# Patient Record
Sex: Male | Born: 1985 | Race: White | Hispanic: No | Smoking: Current every day smoker
Health system: Southern US, Community
[De-identification: ages and names within clinical notes are randomized; demographics above are authoritative.]

## PROBLEM LIST (undated history)

## (undated) DIAGNOSIS — F909 Attention-deficit hyperactivity disorder, unspecified type: Secondary | ICD-10-CM

## (undated) DIAGNOSIS — Z789 Other specified health status: Secondary | ICD-10-CM

## (undated) HISTORY — DX: Attention-deficit hyperactivity disorder, unspecified type: F90.9

## (undated) HISTORY — PX: TESTICLE SURGERY: SHX794

## (undated) HISTORY — PX: HERNIA REPAIR: SHX51

## (undated) HISTORY — PX: BACK SURGERY: SHX140

---
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
## 2013-03-08 ENCOUNTER — Emergency Department (HOSPITAL_COMMUNITY): Payer: Medicaid Other

## 2013-03-08 ENCOUNTER — Inpatient Hospital Stay (HOSPITAL_COMMUNITY)
Admission: EM | Admit: 2013-03-08 | Discharge: 2013-03-09 | DRG: 562 | Disposition: A | Discharge status: Home / Self Care | Payer: Medicaid Other | Attending: General Surgery | Admitting: General Surgery

## 2013-03-08 ENCOUNTER — Encounter (HOSPITAL_COMMUNITY): Payer: Self-pay | Admitting: Emergency Medicine

## 2013-03-08 DIAGNOSIS — S52123A Displaced fracture of head of unspecified radius, initial encounter for closed fracture: Principal | ICD-10-CM | POA: Diagnosis present

## 2013-03-08 DIAGNOSIS — Y990 Civilian activity done for income or pay: Secondary | ICD-10-CM

## 2013-03-08 DIAGNOSIS — S060X1A Concussion with loss of consciousness of 30 minutes or less, initial encounter: Secondary | ICD-10-CM | POA: Diagnosis present

## 2013-03-08 DIAGNOSIS — S32409A Unspecified fracture of unspecified acetabulum, initial encounter for closed fracture: Secondary | ICD-10-CM

## 2013-03-08 DIAGNOSIS — S060X9A Concussion with loss of consciousness of unspecified duration, initial encounter: Secondary | ICD-10-CM

## 2013-03-08 DIAGNOSIS — S060XAA Concussion with loss of consciousness status unknown, initial encounter: Secondary | ICD-10-CM

## 2013-03-08 DIAGNOSIS — W19XXXA Unspecified fall, initial encounter: Secondary | ICD-10-CM

## 2013-03-08 DIAGNOSIS — S329XXA Fracture of unspecified parts of lumbosacral spine and pelvis, initial encounter for closed fracture: Secondary | ICD-10-CM

## 2013-03-08 DIAGNOSIS — S3210XA Unspecified fracture of sacrum, initial encounter for closed fracture: Secondary | ICD-10-CM | POA: Diagnosis present

## 2013-03-08 DIAGNOSIS — S322XXA Fracture of coccyx, initial encounter for closed fracture: Secondary | ICD-10-CM

## 2013-03-08 DIAGNOSIS — S42401A Unspecified fracture of lower end of right humerus, initial encounter for closed fracture: Secondary | ICD-10-CM

## 2013-03-08 DIAGNOSIS — W1789XA Other fall from one level to another, initial encounter: Secondary | ICD-10-CM | POA: Diagnosis present

## 2013-03-08 DIAGNOSIS — F17200 Nicotine dependence, unspecified, uncomplicated: Secondary | ICD-10-CM | POA: Diagnosis present

## 2013-03-08 LAB — CBC WITH DIFFERENTIAL/PLATELET
BASOS PCT: 0 % (ref 0–1)
Basophils Absolute: 0 10*3/uL (ref 0.0–0.1)
Eosinophils Absolute: 0.1 10*3/uL (ref 0.0–0.7)
Eosinophils Relative: 1 % (ref 0–5)
HEMATOCRIT: 45.2 % (ref 39.0–52.0)
HEMOGLOBIN: 15.6 g/dL (ref 13.0–17.0)
LYMPHS ABS: 1 10*3/uL (ref 0.7–4.0)
Lymphocytes Relative: 9 % — ABNORMAL LOW (ref 12–46)
MCH: 30.4 pg (ref 26.0–34.0)
MCHC: 34.5 g/dL (ref 30.0–36.0)
MCV: 88.1 fL (ref 78.0–100.0)
MONO ABS: 0.7 10*3/uL (ref 0.1–1.0)
MONOS PCT: 6 % (ref 3–12)
Neutro Abs: 9.7 10*3/uL — ABNORMAL HIGH (ref 1.7–7.7)
Neutrophils Relative %: 85 % — ABNORMAL HIGH (ref 43–77)
Platelets: 197 10*3/uL (ref 150–400)
RBC: 5.13 MIL/uL (ref 4.22–5.81)
RDW: 12.8 % (ref 11.5–15.5)
WBC: 11.5 10*3/uL — ABNORMAL HIGH (ref 4.0–10.5)

## 2013-03-08 LAB — POCT I-STAT, CHEM 8
BUN: 13 mg/dL (ref 6–23)
CALCIUM ION: 1.23 mmol/L (ref 1.12–1.23)
Chloride: 103 mEq/L (ref 96–112)
Creatinine, Ser: 1 mg/dL (ref 0.50–1.35)
Glucose, Bld: 100 mg/dL — ABNORMAL HIGH (ref 70–99)
HCT: 51 % (ref 39.0–52.0)
HEMOGLOBIN: 17.3 g/dL — AB (ref 13.0–17.0)
Potassium: 4.2 mEq/L (ref 3.7–5.3)
SODIUM: 143 meq/L (ref 137–147)
TCO2: 26 mmol/L (ref 0–100)

## 2013-03-08 LAB — COMPREHENSIVE METABOLIC PANEL
ALBUMIN: 4.4 g/dL (ref 3.5–5.2)
ALK PHOS: 45 U/L (ref 39–117)
ALT: 34 U/L (ref 0–53)
AST: 41 U/L — ABNORMAL HIGH (ref 0–37)
BILIRUBIN TOTAL: 0.3 mg/dL (ref 0.3–1.2)
BUN: 14 mg/dL (ref 6–23)
CHLORIDE: 104 meq/L (ref 96–112)
CO2: 27 mEq/L (ref 19–32)
CREATININE: 0.94 mg/dL (ref 0.50–1.35)
Calcium: 9.2 mg/dL (ref 8.4–10.5)
GFR calc non Af Amer: >90 mL/min (ref >90)
GLUCOSE: 112 mg/dL — AB (ref 70–99)
Potassium: 4.4 mEq/L (ref 3.7–5.3)
Sodium: 141 mEq/L (ref 137–147)
Total Protein: 7.2 g/dL (ref 6.0–8.3)

## 2013-03-08 LAB — URINALYSIS, ROUTINE W REFLEX MICROSCOPIC
Bilirubin Urine: NEGATIVE
GLUCOSE, UA: NEGATIVE mg/dL
Hgb urine dipstick: NEGATIVE
KETONES UR: NEGATIVE mg/dL
LEUKOCYTES UA: NEGATIVE
Nitrite: NEGATIVE
Protein, ur: NEGATIVE mg/dL
Specific Gravity, Urine: 1.01 (ref 1.005–1.030)
Urobilinogen, UA: 0.2 mg/dL (ref 0.0–1.0)
pH: 6.5 (ref 5.0–8.0)

## 2013-03-08 MED ORDER — OXYCODONE HCL 5 MG PO TABS
5.0000 mg | ORAL_TABLET | ORAL | Status: DC | PRN
  Administered 2013-03-09: 5 mg via ORAL
  Filled 2013-03-08: qty 1

## 2013-03-08 MED ORDER — PANTOPRAZOLE SODIUM 40 MG PO TBEC
40.0000 mg | DELAYED_RELEASE_TABLET | Freq: Every day | ORAL | Status: DC
  Administered 2013-03-09: 40 mg via ORAL
  Filled 2013-03-08: qty 1

## 2013-03-08 MED ORDER — SODIUM CHLORIDE 0.9 % IV BOLUS (SEPSIS)
1000.0000 mL | Freq: Once | INTRAVENOUS | Status: AC
  Administered 2013-03-08: 1000 mL via INTRAVENOUS

## 2013-03-08 MED ORDER — HYDROMORPHONE HCL PF 1 MG/ML IJ SOLN
1.0000 mg | INTRAMUSCULAR | Status: DC | PRN
  Administered 2013-03-08 (×2): 1 mg via INTRAVENOUS
  Filled 2013-03-08 (×2): qty 1

## 2013-03-08 MED ORDER — LORAZEPAM 2 MG/ML IJ SOLN
1.0000 mg | Freq: Once | INTRAMUSCULAR | Status: AC
  Administered 2013-03-08: 1 mg via INTRAVENOUS
  Filled 2013-03-08: qty 1

## 2013-03-08 MED ORDER — HYDROMORPHONE HCL PF 1 MG/ML IJ SOLN
1.0000 mg | Freq: Once | INTRAMUSCULAR | Status: AC
  Administered 2013-03-08: 1 mg via INTRAVENOUS
  Filled 2013-03-08: qty 1

## 2013-03-08 MED ORDER — ONDANSETRON HCL 4 MG/2ML IJ SOLN: 4.0000 mg | Freq: Four times a day (QID) | INTRAMUSCULAR | Status: DC | PRN

## 2013-03-08 MED ORDER — INFLUENZA VAC SPLIT QUAD 0.5 ML IM SUSP
0.5000 mL | INTRAMUSCULAR | Status: DC
  Filled 2013-03-08: qty 0.5

## 2013-03-08 MED ORDER — HYDROMORPHONE HCL PF 1 MG/ML IJ SOLN
INTRAMUSCULAR | Status: AC
  Filled 2013-03-08: qty 1

## 2013-03-08 MED ORDER — IOHEXOL 300 MG/ML  SOLN
100.0000 mL | Freq: Once | INTRAMUSCULAR | Status: AC | PRN
  Administered 2013-03-08: 100 mL via INTRAVENOUS

## 2013-03-08 MED ORDER — ENOXAPARIN SODIUM 40 MG/0.4ML ~~LOC~~ SOLN
40.0000 mg | SUBCUTANEOUS | Status: DC
  Administered 2013-03-09: 40 mg via SUBCUTANEOUS
  Filled 2013-03-08: qty 0.4

## 2013-03-08 MED ORDER — PNEUMOCOCCAL VAC POLYVALENT 25 MCG/0.5ML IJ INJ
0.5000 mL | INJECTION | INTRAMUSCULAR | Status: DC
  Filled 2013-03-08: qty 0.5

## 2013-03-08 MED ORDER — KCL IN DEXTROSE-NACL 20-5-0.45 MEQ/L-%-% IV SOLN
INTRAVENOUS | Status: DC
Start: 2013-03-09 — End: 2013-03-09
  Administered 2013-03-09 (×2): via INTRAVENOUS
  Filled 2013-03-08 (×4): qty 1000

## 2013-03-08 MED ORDER — TIZANIDINE HCL 4 MG PO TABS
4.0000 mg | ORAL_TABLET | Freq: Three times a day (TID) | ORAL | Status: DC | PRN
  Administered 2013-03-09: 4 mg via ORAL
  Filled 2013-03-08: qty 1

## 2013-03-08 MED ORDER — ONDANSETRON HCL 4 MG/2ML IJ SOLN
4.0000 mg | Freq: Once | INTRAMUSCULAR | Status: AC
  Administered 2013-03-08: 4 mg via INTRAVENOUS
  Filled 2013-03-08: qty 2

## 2013-03-08 MED ORDER — ACETAMINOPHEN 325 MG PO TABS
650.0000 mg | ORAL_TABLET | ORAL | Status: DC | PRN
  Administered 2013-03-09: 650 mg via ORAL
  Filled 2013-03-08: qty 2

## 2013-03-08 MED ORDER — HYDROMORPHONE HCL PF 1 MG/ML IJ SOLN
1.0000 mg | INTRAMUSCULAR | Status: DC | PRN
  Administered 2013-03-09 (×3): 1 mg via INTRAVENOUS
  Filled 2013-03-08: qty 2
  Filled 2013-03-08 (×2): qty 1

## 2013-03-08 MED ORDER — PANTOPRAZOLE SODIUM 40 MG IV SOLR
40.0000 mg | Freq: Every day | INTRAVENOUS | Status: DC
  Filled 2013-03-08: qty 40

## 2013-03-08 MED ORDER — HYDROMORPHONE HCL PF 1 MG/ML IJ SOLN
1.0000 mg | Freq: Once | INTRAMUSCULAR | Status: AC
  Administered 2013-03-08: 1 mg via INTRAVENOUS

## 2013-03-08 MED ORDER — ONDANSETRON HCL 4 MG PO TABS: 4.0000 mg | ORAL_TABLET | Freq: Four times a day (QID) | ORAL | Status: DC | PRN

## 2013-03-08 MED ORDER — ONDANSETRON HCL 4 MG/2ML IJ SOLN
4.0000 mg | Freq: Three times a day (TID) | INTRAMUSCULAR | Status: DC | PRN
  Administered 2013-03-08: 4 mg via INTRAVENOUS
  Filled 2013-03-08: qty 2

## 2013-03-08 MED ORDER — OXYCODONE HCL 5 MG PO TABS
10.0000 mg | ORAL_TABLET | ORAL | Status: DC | PRN
  Administered 2013-03-09 (×4): 10 mg via ORAL
  Filled 2013-03-08 (×4): qty 2

## 2013-03-08 MED ORDER — SODIUM CHLORIDE 0.9 % IV SOLN
INTRAVENOUS | Status: AC
  Administered 2013-03-08: 23:00:00 via INTRAVENOUS

## 2013-03-08 NOTE — ED Notes (Signed)
Pt reports was approx 25 feet up in a tree cutting the tree down.  Pt says fell out of the tree and landed on r side.  Pt says has lost consciousness twice.  C/O pain to r elbow, left hip and pelvis area.  Pt shaking.

## 2013-03-08 NOTE — ED Notes (Signed)
Beeped through [facility] to [phone].Trauma

## 2013-03-08 NOTE — Progress Notes (Signed)
Dr. Janee Mornhompson paged upon patient's arrival to the floor. Patient oriented to room and call bell. Patient rating pain 5 out of 10; will consult with MD regarding pain medication regimen. Dr. Janee Mornhompson now at the bedside discussing care with patient and patient's wife. Patient resting comfortably in bed. Nursing will continue to monitor.

## 2013-03-08 NOTE — ED Provider Notes (Signed)
CSN: [id number]     Arrival date & time 03/08/13  1042 History  This chart was scribed for Benny Lennert, MD,  by Ashley Jacobs, ED Scribe. The patient was seen in room APA02/APA02 and the patient's care was started at 11:19 AM.    First MD Initiated Contact with Patient 03/08/13 1114     Chief Complaint  Patient presents with  . Fall   (Consider location/radiation/quality/duration/timing/severity/associated sxs/prior Treatment) Patient is a 28 y.o. male presenting with fall. The history is provided by the patient and medical records (the pt fell 25 feet from a tree with loc.  landed on dirt.  pain left groin and right elbow). No language interpreter was used.  Fall This is a new problem. The current episode started 1 to 2 hours ago. The problem occurs constantly. The problem has not changed since onset.Pertinent negatives include no chest pain and no abdominal pain. Nothing aggravates the symptoms. Nothing relieves the symptoms.   HPI Comments: DIJON COSENS is a 28 y.o. male who presents to the Emergency Department complaining of fall that occurred today. Pt states while cutting a tree down he fell 25 feet landing on his right side. Pt states he landed head first and had LOC twice.  He is experiencing pain from his right elbow down and his left hip that extend to his lower pelvis. He denies chest pain and abdominal pain. Pt does not have any known allergies to medications. Pt states he is experiencing left leg tremors due to pain.  History reviewed. No pertinent past medical history. Past Surgical History  Procedure Laterality Date  . Hernia repair     No family history on file. History  Substance Use Topics  . Smoking status: Current Every Day Smoker  . Smokeless tobacco: Not on file  . Alcohol Use: Yes     Comment: occ    Review of Systems  Constitutional: Negative for appetite change and fatigue.  HENT: Negative for congestion, ear discharge and sinus pressure.   Eyes:  Negative for discharge.  Respiratory: Negative for cough.   Cardiovascular: Negative for chest pain.  Gastrointestinal: Negative for abdominal pain and diarrhea.  Genitourinary: Negative for frequency and hematuria.  Musculoskeletal: Positive for arthralgias and gait problem. Negative for back pain.  Skin: Negative for rash.  Neurological: Positive for tremors (associated with pain) and syncope. Negative for seizures.  Psychiatric/Behavioral: Negative for hallucinations.  All other systems reviewed and are negative.    Allergies  Review of patient's allergies indicates no known allergies.  Home Medications  No current outpatient prescriptions on file. BP 147/76  Pulse 71  Temp(Src) 97.9 F (36.6 C) (Oral)  Resp 20  Ht 6\' 1"  (1.854 m)  Wt 160 lb (72.576 kg)  BMI 21.11 kg/m2  SpO2 100% Physical Exam  Constitutional: He is oriented to person, place, and time. He appears well-developed.  HENT:  Head: Normocephalic.  Eyes: Conjunctivae and EOM are normal. No scleral icterus.  Neck: Neck supple. No tracheal deviation present. No thyromegaly present.  Cardiovascular: Normal rate and regular rhythm.  Exam reveals no gallop and no friction rub.   No murmur heard. Pulmonary/Chest: No stridor. He has no wheezes. He has no rales. He exhibits no tenderness.  Abdominal: He exhibits no distension. There is no tenderness. There is no rebound.  Musculoskeletal: Normal range of motion. He exhibits tenderness. He exhibits no edema.  Tenderness over right elbow: neurovascularly intact Tenderness over the hip and left inguinal area: neurovascularly intact  Lymphadenopathy:    He has no cervical adenopathy.  Neurological: He is oriented to person, place, and time. He exhibits normal muscle tone. Coordination normal.  Skin: Skin is warm. No rash noted. No erythema.  Psychiatric: He has a normal mood and affect. His behavior is normal.    ED Course  Procedures (including critical care  time) DIAGNOSTIC STUDIES: Oxygen Saturation is 100% on room air, normal by my interpretation.    COORDINATION OF CARE:  11:22 AM Discussed course of care with pt which includes  Pelvic x-ray. Pt understands and agrees.   CRITICAL CARE Performed by: Amee Boothe L Total critical care time: 45 Critical care time was exclusive of separately billable procedures and treating other patients. Critical care was necessary to treat or prevent imminent or life-threatening deterioration. Critical care was time spent personally by me on the following activities: development of treatment plan with patient and/or surrogate as well as nursing, discussions with consultants, evaluation of patient's response to treatment, examination of patient, obtaining history from patient or surrogate, ordering and performing treatments and interventions, ordering and review of laboratory studies, ordering and review of radiographic studies, pulse oximetry and re-evaluation of patient's condition.  Labs Review Labs Reviewed - No data to display Imaging Review No results found.  EKG Interpretation   None       MDM  fx pelvis and right arm.  Admit to dr..  Janee Mornhompson cone   Benny LennertJoseph L Donavon Kimrey, MD 03/08/13 [phone]

## 2013-03-08 NOTE — ED Notes (Signed)
Pt c/o right elbow and arm pain as well as left hip and pelvic pain. Pt states he fell approximately 25 feet from a tree and landed on his right side. Pt reports LOC x2 at scene of incident. Pt was not ambulatory after fall. Limited movement in RUE and LLE. Deformity noted on right elbow.

## 2013-03-09 ENCOUNTER — Inpatient Hospital Stay (HOSPITAL_COMMUNITY): Payer: Medicaid Other

## 2013-03-09 LAB — CBC
HCT: 40.2 % (ref 39.0–52.0)
HEMOGLOBIN: 13.7 g/dL (ref 13.0–17.0)
MCH: 30.2 pg (ref 26.0–34.0)
MCHC: 34.1 g/dL (ref 30.0–36.0)
MCV: 88.7 fL (ref 78.0–100.0)
PLATELETS: 140 10*3/uL — AB (ref 150–400)
RBC: 4.53 MIL/uL (ref 4.22–5.81)
RDW: 13.2 % (ref 11.5–15.5)
WBC: 8 10*3/uL (ref 4.0–10.5)

## 2013-03-09 LAB — BASIC METABOLIC PANEL
BUN: 8 mg/dL (ref 6–23)
CALCIUM: 8.1 mg/dL — AB (ref 8.4–10.5)
CHLORIDE: 105 meq/L (ref 96–112)
CO2: 23 meq/L (ref 19–32)
Creatinine, Ser: 0.84 mg/dL (ref 0.50–1.35)
GFR calc Af Amer: >90 mL/min (ref >90)
GFR calc non Af Amer: >90 mL/min (ref >90)
GLUCOSE: 86 mg/dL (ref 70–99)
Potassium: 4 mEq/L (ref 3.7–5.3)
Sodium: 138 mEq/L (ref 137–147)

## 2013-03-09 MED ORDER — OXYCODONE-ACETAMINOPHEN 5-325 MG PO TABS
1.0000 | ORAL_TABLET | ORAL | Status: DC | PRN
  Administered 2013-03-09 (×2): 1 via ORAL
  Filled 2013-03-09 (×2): qty 1

## 2013-03-09 MED ORDER — DIPHENHYDRAMINE HCL 25 MG PO CAPS: 25.0000 mg | ORAL_CAPSULE | Freq: Four times a day (QID) | ORAL | Status: DC | PRN

## 2013-03-09 MED ORDER — KETOROLAC TROMETHAMINE 30 MG/ML IJ SOLN
30.0000 mg | Freq: Once | INTRAMUSCULAR | Status: AC
  Administered 2013-03-09: 30 mg via INTRAVENOUS
  Filled 2013-03-09: qty 1

## 2013-03-09 MED ORDER — OXYCODONE HCL 5 MG PO TABS: 5.0000 mg | ORAL_TABLET | ORAL | Status: DC | PRN

## 2013-03-09 MED ORDER — DIPHENHYDRAMINE HCL 50 MG/ML IJ SOLN
25.0000 mg | Freq: Four times a day (QID) | INTRAMUSCULAR | Status: DC | PRN
  Administered 2013-03-09 (×2): 25 mg via INTRAVENOUS
  Filled 2013-03-09 (×2): qty 1

## 2013-03-09 MED ORDER — KETOROLAC TROMETHAMINE 15 MG/ML IJ SOLN
15.0000 mg | Freq: Four times a day (QID) | INTRAMUSCULAR | Status: DC
  Administered 2013-03-09: 15 mg via INTRAVENOUS
  Filled 2013-03-09: qty 1

## 2013-03-09 MED ORDER — OXYCODONE-ACETAMINOPHEN 5-325 MG PO TABS: 1.0000 | ORAL_TABLET | ORAL | Status: DC | PRN

## 2013-03-09 NOTE — Discharge Summary (Signed)
Physician Discharge Summary  Patient ID: Clinton Evans MRN: [id number] DOB/AGE: 28/08/1985 28 y.o.  Admit date: 03/08/2013 Discharge date: 03/09/2013  Admission Diagnoses:  Discharge Diagnoses:  Active Problems:   Pelvis fracture   Acetabular fracture   Discharged Condition: good  Hospital Course: Admitted after falling out of a tree at work.  Sustained non-displaced pelvic fractures and a shattered right radial head.  Will need surgery to replace radial head in the near future  Consults: orthopedic surgery  Significant Diagnostic Studies: labs: basic bloodwork and radiology: CXR: normal, X-Ray: pelvis with ala and rami fractures minimally displaced and Extremity fractures which demonstrate comminuted radial head fracture.  CT done also of elbow.  Treatments: IV hydration and analgesia: Dilaudid and Percocet and Toradol  Discharge Exam: Blood pressure 117/72, pulse 54, temperature 97.4 F (36.3 C), temperature source Oral, resp. rate 18, height 6\' 1"  (1.854 m), weight 72.576 kg (160 lb), SpO2 100.00%. General appearance: alert, cooperative, appears stated age, no distress and appears anxious Chest wall: no tenderness Extremities: right arm in sling for right elbow fracture  Disposition: Final discharge disposition not confirmed      Discharge Orders   Future Orders Complete By Expires   Call MD for:  difficulty breathing, headache or visual disturbances  As directed    Call MD for:  extreme fatigue  As directed    Call MD for:  hives  As directed    Call MD for:  persistant dizziness or light-headedness  As directed    Call MD for:  persistant nausea and vomiting  As directed    Call MD for:  redness, tenderness, or signs of infection (pain, swelling, redness, odor or green/yellow discharge around incision site)  As directed    Call MD for:  severe uncontrolled pain  As directed    Call MD for:  temperature >100.4  As directed    Diet general  As directed    Discharge  instructions  As directed    Comments:     Do not attempt to use right arm or elbow.  Keep in sling at all times.  Will have surgery scheduled within the next 5-10 days.  Dr. Magdalene PatriciaHandy's office will call to make arrangements.   Driving Restrictions  As directed    Comments:     No driving until cleared by orthopedic surgeon   Increase activity slowly  As directed    Comments:     Do not attempt to drive or work until cleared by the orthopedic surgeon.   Lifting restrictions  As directed    Comments:     No lifting until otherwise instructed.   May shower / Bathe  As directed    May walk up steps  As directed    Other Restrictions  As directed    Comments:     No smoking.  This will impede healing.       Medication List         ibuprofen 200 MG tablet  Commonly known as:  ADVIL,MOTRIN  Take 200 mg by mouth every 6 (six) hours as needed for moderate pain.     oxyCODONE 5 MG immediate release tablet  Commonly known as:  Oxy IR/ROXICODONE  Take 1 tablet (5 mg total) by mouth every 4 (four) hours as needed for severe pain.     oxyCODONE-acetaminophen 5-325 MG per tablet  Commonly known as:  PERCOCET/ROXICET  Take 1 tablet by mouth every 4 (four) hours as needed for  severe pain.       Follow-up Information   Schedule an appointment as soon as possible for a visit with Budd Palmer, MD. (he will call this patient to schedule surgery)    Specialty:  Orthopedic Surgery   Contact information:   [address] [place] [address] [phone]       Signed: Cherylynn Ridges 03/09/2013, 5:25 PM

## 2013-03-09 NOTE — Consult Note (Signed)
Orthopaedic Trauma Service Consult  Pt seen and examined Consult dictated: [id number]  A/P  1. Fall 2. TBI 3. L LC1 pelvic ring fx  wbat  No rom restrictions  PT/OT   Plain films of pelvis for baseline- AP, inlet/outlet  4. R Radial head fx  CT scan to determine if surgery indicated  5. Continue per TS  Mearl LatinKeith W. Venetia Prewitt, PA-C [facility] [phone] (P) 03/09/2013 9:38 AM

## 2013-03-09 NOTE — Progress Notes (Signed)
Orthopedic Tech Progress Note Patient Details:  Clinton Evans 10/30/1985 [id number] Arm sling applied for immobilization and comfort Ortho Devices Type of Ortho Device: Arm sling Ortho Device/Splint Location: Righr Ortho Device/Splint Interventions: Application   Asia R Thompson 03/09/2013, 9:45 AM

## 2013-03-09 NOTE — Progress Notes (Signed)
Trauma Service Note  Subjective: Patient very uncomfortable, mostly his right elbow.  Pain medications not working well.  Objective: Vital signs in last 24 hours: Temp:  [97.1 F (36.2 C)-98.3 F (36.8 C)] 97.1 F (36.2 C) (01/07 0900) Pulse Rate:  [58-113] 59 (01/07 0900) Resp:  [12-20] 18 (01/07 0900) BP: (101-147)/(48-76) 112/63 mmHg (01/07 0900) SpO2:  [98 %-100 %] 100 % (01/07 0900) Weight:  [72.576 kg (160 lb)] 72.576 kg (160 lb) (01/06 1108) Last BM Date: 03/08/13  Intake/Output from previous day: 01/06 0701 - 01/07 0700 In: 1265.3 [P.O.:422; I.V.:843.3] Out: -  Intake/Output this shift: Total I/O In: 420 [P.O.:420] Out: -   General: Does not appear to be in acute distress  Lungs: Clear  Abd: Benign  Extremities: Right arm in sling  Neuro: Intact  Lab Results: CBC   Recent Labs  03/08/13 1122 03/08/13 1145 03/09/13 0335  WBC 11.5*  --  8.0  HGB 15.6 17.3* 13.7  HCT 45.2 51.0 40.2  PLT 197  --  140*   BMET  Recent Labs  03/08/13 1122 03/08/13 1145 03/09/13 0335  NA 141 143 138  K 4.4 4.2 4.0  CL 104 103 105  CO2 27  --  23  GLUCOSE 112* 100* 86  BUN 14 13 8   CREATININE 0.94 1.00 0.84  CALCIUM 9.2  --  8.1*   PT/INR No results found for this basename: LABPROT, INR,  in the last 72 hours ABG No results found for this basename: PHART, PCO2, PO2, HCO3,  in the last 72 hours  Studies/Results: Dg Elbow Complete Right  03/08/2013   CLINICAL DATA:  Fall.  Pain.  EXAM: RIGHT ELBOW - COMPLETE 3+ VIEW  COMPARISON:  None.  FINDINGS: Comminuted impaction fracture of the right radial head. Radial head may be slightly subluxed laterally. Joint effusion.  IMPRESSION: Comminuted impaction fracture of the right radial head. Radial head may be slightly subluxed laterally. Joint effusion.   Electronically Signed   By: Bridgett LarssonSteve  Olson M.D.   On: 03/08/2013 12:53   Dg Hip Complete Left  03/08/2013   CLINICAL DATA:  Left hip pain post fall in 25 feet from tree.   EXAM: LEFT HIP - COMPLETE 2+ VIEW  COMPARISON:  CT same date.  FINDINGS: Left sacral fracture.  The left acetabular fracture and possible left femoral head fracture are better delineated on the CT performed at same time. Please see report from such.  IMPRESSION: Left sacral fracture.  The left acetabular fracture and possible left femoral head fracture are better delineated on the CT performed at same time. Please see report from such.   Electronically Signed   By: Bridgett LarssonSteve  Olson M.D.   On: 03/08/2013 12:32   Ct Head Wo Contrast  03/08/2013   CLINICAL DATA:  25 foot fall with loss of consciousness. Right-sided arm pain.  EXAM: CT HEAD WITHOUT CONTRAST  CT CERVICAL SPINE WITHOUT CONTRAST  TECHNIQUE: Multidetector CT imaging of the head and cervical spine was performed following the standard protocol without intravenous contrast. Multiplanar CT image reconstructions of the cervical spine were also generated.  COMPARISON:  None.  FINDINGS: CT HEAD FINDINGS  No acute cortical infarct, hemorrhage, or mass lesion is present. The ventricles are of normal size. And no significant extra-axial fluid collection is present.  A comminuted right nasal bone fractures appear remote. There is no significant soft tissue swelling associated. Please correlate with point tenderness. The osseous skull is otherwise intact. The paranasal sinuses and mastoid air  cells are clear. No significant extracranial soft tissue injury is evident.  CT CERVICAL SPINE FINDINGS  The cervical spine is imaged from the skull base through T1-2. Vertebral body heights and alignment are maintained. No acute fracture or traumatic subluxation is evident. The lung apices are clear.  IMPRESSION: 1. No acute intracranial abnormality. 2. Comminuted right nasal bone fractures appear remote. 3. Negative CT of the cervical spine.   Electronically Signed   By: Gennette Pac M.D.   On: 03/08/2013 12:23   Ct Chest W Contrast  03/08/2013   CLINICAL DATA:  28 year old  male status post 25 foot fall. Positive loss of consciousness. Pain. Back pain. Initial encounter.  EXAM: CT CHEST, ABDOMEN, AND PELVIS WITH CONTRAST  TECHNIQUE: Multidetector CT imaging of the chest, abdomen and pelvis was performed following the standard protocol during bolus administration of intravenous contrast.  CONTRAST:  OMNIPAQUE IOHEXOL 300 MG/ML  SOLN  COMPARISON:  Cervical spine CT from the same day reported separately.  FINDINGS: CT CHEST FINDINGS  Major airways are patent. No pneumothorax. No abnormal pulmonary opacity. No pericardial or pleural effusion. Negative soft tissues at the thoracic inlet. No mediastinal hematoma. Major mediastinal vascular structures appear within normal limits.  No superficial chest wall soft tissue injury identified. Bone mineralization is within normal limits. Clavicles intact. Sternum intact. No rib fracture identified. Visible shoulder osseous structures including the scapula appear intact. Paraspinal soft tissues appear within normal limits. No thoracic vertebral fracture identified.  CT ABDOMEN AND PELVIS FINDINGS  Normal lumbar segmentation.  Lumbar spine intact.  Nondisplaced left sacral ala fracture (series 2, image 101). Otherwise the sacrum appears intact. SI joints intact. Subtle nondisplaced left pelvic fracture at the confluence of the acetabulum and superior pubic ramus, see series 2, image 117 and coronal images 33-36. Pubic rami intact. Proximal left femur appears intact except for a an unusual linear hypodensity in the left femoral head, without definite associated cortical disruption (see series 2, image 121). Elsewhere the pelvis and proximal femurs appear intact.  No pelvic free fluid. Negative distal colon. Unremarkable bladder. Negative sigmoid and left colon. Negative transverse colon.  In the right lower quadrant there is trace free fluid adjacent to the cecum. Evidence of a normal retrocecal appendix. No cecal wall thickening. Fluid-filled  but otherwise negative terminal ileum. No dilated small bowel. Negative stomach and duodenum.  No pneumoperitoneum. No abdominal free fluid. Liver, gallbladder, spleen, pancreas, adrenal glands, and kidneys are intact. Portal venous system and major arterial structures in the abdomen and pelvis appear within normal limits. Evidence of a previous right inguinal hernia repair.  IMPRESSION: 1. Subtle nondisplaced left acetabular and left sacral ala fractures. 1 usual linear lucency through the left femoral head (series 2, image 121), but no definite proximal left femur fracture. 2. Small volume free fluid in the right lower quadrant. This is abnormal in a male patient but nonspecific. Bowel injury can be occult on initial trauma CT scans. 3. No acute traumatic injury identified in the chest.   Electronically Signed   By: Augusto Gamble M.D.   On: 03/08/2013 12:42   Ct Cervical Spine Wo Contrast  03/08/2013   CLINICAL DATA:  25 foot fall with loss of consciousness. Right-sided arm pain.  EXAM: CT HEAD WITHOUT CONTRAST  CT CERVICAL SPINE WITHOUT CONTRAST  TECHNIQUE: Multidetector CT imaging of the head and cervical spine was performed following the standard protocol without intravenous contrast. Multiplanar CT image reconstructions of the cervical spine were also generated.  COMPARISON:  None.  FINDINGS: CT HEAD FINDINGS  No acute cortical infarct, hemorrhage, or mass lesion is present. The ventricles are of normal size. And no significant extra-axial fluid collection is present.  A comminuted right nasal bone fractures appear remote. There is no significant soft tissue swelling associated. Please correlate with point tenderness. The osseous skull is otherwise intact. The paranasal sinuses and mastoid air cells are clear. No significant extracranial soft tissue injury is evident.  CT CERVICAL SPINE FINDINGS  The cervical spine is imaged from the skull base through T1-2. Vertebral body heights and alignment are maintained.  No acute fracture or traumatic subluxation is evident. The lung apices are clear.  IMPRESSION: 1. No acute intracranial abnormality. 2. Comminuted right nasal bone fractures appear remote. 3. Negative CT of the cervical spine.   Electronically Signed   By: Gennette Pac M.D.   On: 03/08/2013 12:23   Ct Abdomen Pelvis W Contrast  03/08/2013   CLINICAL DATA:  28 year old male status post 25 foot fall. Positive loss of consciousness. Pain. Back pain. Initial encounter.  EXAM: CT CHEST, ABDOMEN, AND PELVIS WITH CONTRAST  TECHNIQUE: Multidetector CT imaging of the chest, abdomen and pelvis was performed following the standard protocol during bolus administration of intravenous contrast.  CONTRAST:  OMNIPAQUE IOHEXOL 300 MG/ML  SOLN  COMPARISON:  Cervical spine CT from the same day reported separately.  FINDINGS: CT CHEST FINDINGS  Major airways are patent. No pneumothorax. No abnormal pulmonary opacity. No pericardial or pleural effusion. Negative soft tissues at the thoracic inlet. No mediastinal hematoma. Major mediastinal vascular structures appear within normal limits.  No superficial chest wall soft tissue injury identified. Bone mineralization is within normal limits. Clavicles intact. Sternum intact. No rib fracture identified. Visible shoulder osseous structures including the scapula appear intact. Paraspinal soft tissues appear within normal limits. No thoracic vertebral fracture identified.  CT ABDOMEN AND PELVIS FINDINGS  Normal lumbar segmentation.  Lumbar spine intact.  Nondisplaced left sacral ala fracture (series 2, image 101). Otherwise the sacrum appears intact. SI joints intact. Subtle nondisplaced left pelvic fracture at the confluence of the acetabulum and superior pubic ramus, see series 2, image 117 and coronal images 33-36. Pubic rami intact. Proximal left femur appears intact except for a an unusual linear hypodensity in the left femoral head, without definite associated cortical  disruption (see series 2, image 121). Elsewhere the pelvis and proximal femurs appear intact.  No pelvic free fluid. Negative distal colon. Unremarkable bladder. Negative sigmoid and left colon. Negative transverse colon.  In the right lower quadrant there is trace free fluid adjacent to the cecum. Evidence of a normal retrocecal appendix. No cecal wall thickening. Fluid-filled but otherwise negative terminal ileum. No dilated small bowel. Negative stomach and duodenum.  No pneumoperitoneum. No abdominal free fluid. Liver, gallbladder, spleen, pancreas, adrenal glands, and kidneys are intact. Portal venous system and major arterial structures in the abdomen and pelvis appear within normal limits. Evidence of a previous right inguinal hernia repair.  IMPRESSION: 1. Subtle nondisplaced left acetabular and left sacral ala fractures. 1 usual linear lucency through the left femoral head (series 2, image 121), but no definite proximal left femur fracture. 2. Small volume free fluid in the right lower quadrant. This is abnormal in a male patient but nonspecific. Bowel injury can be occult on initial trauma CT scans. 3. No acute traumatic injury identified in the chest.   Electronically Signed   By: Augusto Gamble M.D.   On: 03/08/2013 12:42  Anti-infectives: Anti-infectives   None      Assessment/Plan: s/p  Increase pain medications including toradol which is not necessarily condoned by orth.  LOS: 1 day   Marta Lamas. Gae Bon, MD, FACS [phone] Trauma Surgeon 03/09/2013

## 2013-03-09 NOTE — Consult Note (Signed)
NAME:  Clinton Evans, Clinton Evans                 ACCOUNT NO.:  [id number]  MEDICAL RECORD NO.:  [id number]  LOCATION:  5N10C                        FACILITY:  [facility]  PHYSICIAN:  Doralee AlbinoMichael H. Carola FrostHandy, M.D. DATE OF BIRTH:  [birth year]  DATE OF CONSULTATION:  03/09/2013 DATE OF DISCHARGE:                                CONSULTATION   REQUESTING SERVICE:  Gabrielle DareBurke E. Janee Mornhompson, MD, Trauma Service.  REASON FOR CONSULTATION:  Fall with a pelvic ring fracture and right radial head fracture.  HISTORY OF PRESENT ILLNESS:  Clinton Evans is a very pleasant [age] year old, white male, who is approximately 25 feet up in a tree cutting limbs when his cleat gave way and his harness failed.  He again fell approximately 25 feet landing on the dirt.  He believes he landed on his right side, but is unsure.  He did have a brief loss of consciousness.  He was brought to [facility] where he was initially evaluated, where he was found to have left pelvic ring fracture, as well as a right radial head fracture.  In addition, CT scan also showed some free fluid in his abdomen and as such, he was transferred to the [facility] for the Trauma Service for continued observation.  The patient arrived early this morning, was complaining of some persistent left-sided pelvic pain as well as some mild right elbow pain.  He was splinted with a coaptation splint to the right arm.  Currently, he is in [facility] [place] room [facility], again he complains of right elbow pain as well as some left-sided pelvic pain which he states is difficult to bear weight on.  He denied any numbness or tingling.  Denies any additional injuries elsewhere.  No chest pain.  No shortness of breath.  No abdominal pain.  No other issues of note.  The patient denies any headaches.  No visual changes. No neck pain.  PAST MEDICAL HISTORY:  The patient denies.  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  FAMILY HISTORY:  Noncontributory.  SOCIAL  HISTORY:  Patient smokes about a pack a day.  He is social drinker.  Does not do any other drugs, has 2 small children.  He is married.  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  ALLERGIES:  No known drug allergies.  MEDICATIONS:  Prior to admission, Advil.  CURRENT LABS:  Hemoglobin 13.7, hematocrit 40.2, platelets 140.  Sodium 138, potassium 4.0, chloride 105, bicarb 23, BUN 8, creatinine 0.84, calcium 8.1, glucose 86.  REVIEW OF SYSTEMS:  As noted above in the HPI.  PHYSICAL EXAMINATION:  VITAL SIGNS:  Temperature 97.7, heart rate 113, BP is 112/67, 98% on room air. GENERAL:  Patient is very pleasant white male, oriented to person, place, and time.  Does not appear to be any acute distress. HEENT:  Head is atraumatic.  Extraocular muscles are intact.  Moist mucous membranes are noted.  NECK:  Supple.  No tenderness with palpation. CARDIAC:  S1, S2. ABDOMEN:  Soft, nontender.  Positive bowel sounds. PELVIS:  No gross instability, mild discomfort with lateral compression to the left hemipelvis.  No pain with palpation over his pubic symphysis. EXTREMITIES:  Right upper extremity, patient is in a sugar tong splint to forearm. His clavicle, shoulder, and upper arm are unremarkable.  Upon removal of the splint, he has some swelling over his radial head and tenderness to palpation.  Radial, ulnar, median, axillary nerve, motor and sensory functions are grossly intact distally.  Extremities warm, palpable radial pulse.  He does have quite dirty hands.  Tattoo also noted to the right arm.  No pain with active motion of his shoulder, wrist, or hand. Again nontender to palpation, wrist or hand or shoulder.  Some discomfort with elbow motion, but tolerable.  Left upper extremity, unremarkable.  No acute findings.  Motor and sensory function intact. Bilateral lower extremities, hips, knees, and ankles are unremarkable. The patient demonstrates excellent range of motion involving  joints. Deep peroneal nerve, superficial peroneal nerve, tibial nerve, sensory function are intact bilaterally.  EHL, FHL, anterior tibialis, posterior tibialis, peroneals, gastrocsoleus complex, motor function are intact bilaterally.  Quadriceps and hamstring motor function grossly intact as well bilaterally.  The extremities are warm.  Palpable dorsalis pedis pulses appreciated bilaterally.  No pain with axial loading or logrolling of the hips bilaterally.  No significant swelling is appreciated.  Compartments are all soft and nontender.  He does have some discomfort with movement of his left leg which is referred to his pelvis.  Plain film of the right elbow demonstrates a relatively nondisplaced impacted right radial head and neck fracture.  CT of his abdomen and pelvis demonstrates a high superior pubic rami fracture, also a fracture of the left sacral ala.  There is also some lucency in the left femoral head, but I do not appreciate a true fracture line with disruption of the cortex anteriorly and posteriorly.  ASSESSMENT AND PLAN:  A 28 year old male status post fall from height. 1. Fall. 2. Traumatic brain injury with concussion per Trauma Service. 3. Left LC1 pelvic ring fracture.  We will check AP pelvis and inlet     outlet films for baseline prior to immobilization.  The pelvic ring     overall was stable and patient can weightbear as tolerated to pain     tolerance.  No range of motion restrictions.  We will continue to     follow accordingly and see how he does with therapy. 4. Right radial head fracture.  We will obtain a CT scan of this     fracture to fully evaluate to determine if surgical intervention is     needed.  The patient was taken out of his splint for comfort.  He     can be in a sling for comfort.  Ice and elevate as needed.  It is     okay for him to move his fingers, wrist, as well as elbow with     flexion and extension for the time being.  Once we have  the CT     scan back, we will then determine best course of action, and we     will make further recommendations for therapies. 5. Nicotine dependence.  We did discuss the negative effects of     nicotine on bone healing, the patient     demonstrates a full understanding of this and we will try to     decrease and cutback altogether. 6. Continue per Trauma Service.  Thank you for this consult.  We will continue to follow along.     Mearl Latin, PA   ______________________________ Doralee Albino. Carola Frost, M.D.  KWP/MEDQ  D:  03/09/2013  T:  03/09/2013  Job:  [id number]

## 2013-03-09 NOTE — H&P (Signed)
Clinton Evans is an 28 y.o. male.   Chief Complaint: left pelvis pain HPI: Patient was 25 feet up in a tree cutting limbs when he fell. He landed on dirt. He had a brief loss of consciousness. He was evaluated at [facility] emergency department. He was found to have left acetabular fracture, left sacral ala fracture, and right radius fracture. He also had a tiny amount of free fluid in his abdomen. I accepted him in transfer to the trauma service at [facility]. On arrival here, he continues to have some pain mostly in his left pelvis. He is some mild right elbow pain. He denies abdominal pain.  History reviewed. No pertinent past medical history.  Past Surgical History  Procedure Laterality Date  . Hernia repair    Bilateral inguinal hernia repair L orchiectomy due to torsion  No family history on file. Social History:  reports that he has been smoking.  He does not have any smokeless tobacco history on file. He reports that he drinks alcohol. He reports that he does not use illicit drugs.  Allergies: No Known Allergies  Medications Prior to Admission  Medication Sig Dispense Refill  . ibuprofen (ADVIL,MOTRIN) 200 MG tablet Take 200 mg by mouth every 6 (six) hours as needed for moderate pain.        Results for orders placed during the hospital encounter of 03/08/13 (from the past 48 hour(s))  CBC WITH DIFFERENTIAL     Status: Abnormal   Collection Time    03/08/13 11:22 AM      Result Value Range   WBC 11.5 (*) 4.0 - 10.5 K/uL   RBC 5.13  4.22 - 5.81 MIL/uL   Hemoglobin 15.6  13.0 - 17.0 g/dL   HCT 45.2  39.0 - 52.0 %   MCV 88.1  78.0 - 100.0 fL   MCH 30.4  26.0 - 34.0 pg   MCHC 34.5  30.0 - 36.0 g/dL   RDW 12.8  11.5 - 15.5 %   Platelets 197  150 - 400 K/uL   Neutrophils Relative % 85 (*) 43 - 77 %   Neutro Abs 9.7 (*) 1.7 - 7.7 K/uL   Lymphocytes Relative 9 (*) 12 - 46 %   Lymphs Abs 1.0  0.7 - 4.0 K/uL   Monocytes Relative 6  3 - 12 %   Monocytes Absolute 0.7  0.1 - 1.0  K/uL   Eosinophils Relative 1  0 - 5 %   Eosinophils Absolute 0.1  0.0 - 0.7 K/uL   Basophils Relative 0  0 - 1 %   Basophils Absolute 0.0  0.0 - 0.1 K/uL  COMPREHENSIVE METABOLIC PANEL     Status: Abnormal   Collection Time    03/08/13 11:22 AM      Result Value Range   Sodium 141  137 - 147 mEq/L   Potassium 4.4  3.7 - 5.3 mEq/L   Chloride 104  96 - 112 mEq/L   CO2 27  19 - 32 mEq/L   Glucose, Bld 112 (*) 70 - 99 mg/dL   BUN 14  6 - 23 mg/dL   Creatinine, Ser 0.94  0.50 - 1.35 mg/dL   Calcium 9.2  8.4 - 10.5 mg/dL   Total Protein 7.2  6.0 - 8.3 g/dL   Albumin 4.4  3.5 - 5.2 g/dL   AST 41 (*) 0 - 37 U/L   ALT 34  0 - 53 U/L   Alkaline Phosphatase 45  39 - 117 U/L   Total Bilirubin 0.3  0.3 - 1.2 mg/dL   GFR calc non Af Amer >90  >90 mL/min   GFR calc Af Amer >90  >90 mL/min   Comment: (NOTE)     The eGFR has been calculated using the CKD EPI equation.     This calculation has not been validated in all clinical situations.     eGFR's persistently <90 mL/min signify possible Chronic Kidney     Disease.  POCT I-STAT, CHEM 8     Status: Abnormal   Collection Time    03/08/13 11:45 AM      Result Value Range   Sodium 143  137 - 147 mEq/L   Potassium 4.2  3.7 - 5.3 mEq/L   Chloride 103  96 - 112 mEq/L   BUN 13  6 - 23 mg/dL   Creatinine, Ser 1.00  0.50 - 1.35 mg/dL   Glucose, Bld 100 (*) 70 - 99 mg/dL   Calcium, Ion 1.23  1.12 - 1.23 mmol/L   TCO2 26  0 - 100 mmol/L   Hemoglobin 17.3 (*) 13.0 - 17.0 g/dL   HCT 51.0  39.0 - 52.0 %  URINALYSIS, ROUTINE W REFLEX MICROSCOPIC     Status: None   Collection Time    03/08/13  1:32 PM      Result Value Range   Color, Urine YELLOW  YELLOW   APPearance CLEAR  CLEAR   Specific Gravity, Urine 1.010  1.005 - 1.030   pH 6.5  5.0 - 8.0   Glucose, UA NEGATIVE  NEGATIVE mg/dL   Hgb urine dipstick NEGATIVE  NEGATIVE   Bilirubin Urine NEGATIVE  NEGATIVE   Ketones, ur NEGATIVE  NEGATIVE mg/dL   Protein, ur NEGATIVE  NEGATIVE mg/dL    Urobilinogen, UA 0.2  0.0 - 1.0 mg/dL   Nitrite NEGATIVE  NEGATIVE   Leukocytes, UA NEGATIVE  NEGATIVE   Comment: MICROSCOPIC NOT DONE ON URINES WITH NEGATIVE PROTEIN, BLOOD, LEUKOCYTES, NITRITE, OR GLUCOSE <1000 mg/dL.   Dg Elbow Complete Right  03/08/2013   CLINICAL DATA:  Fall.  Pain.  EXAM: RIGHT ELBOW - COMPLETE 3+ VIEW  COMPARISON:  None.  FINDINGS: Comminuted impaction fracture of the right radial head. Radial head may be slightly subluxed laterally. Joint effusion.  IMPRESSION: Comminuted impaction fracture of the right radial head. Radial head may be slightly subluxed laterally. Joint effusion.   Electronically Signed   By: Chauncey Cruel M.D.   On: 03/08/2013 12:53   Dg Hip Complete Left  03/08/2013   CLINICAL DATA:  Left hip pain post fall in 25 feet from tree.  EXAM: LEFT HIP - COMPLETE 2+ VIEW  COMPARISON:  CT same date.  FINDINGS: Left sacral fracture.  The left acetabular fracture and possible left femoral head fracture are better delineated on the CT performed at same time. Please see report from such.  IMPRESSION: Left sacral fracture.  The left acetabular fracture and possible left femoral head fracture are better delineated on the CT performed at same time. Please see report from such.   Electronically Signed   By: Chauncey Cruel M.D.   On: 03/08/2013 12:32   Ct Head Wo Contrast  03/08/2013   CLINICAL DATA:  25 foot fall with loss of consciousness. Right-sided arm pain.  EXAM: CT HEAD WITHOUT CONTRAST  CT CERVICAL SPINE WITHOUT CONTRAST  TECHNIQUE: Multidetector CT imaging of the head and cervical spine was performed following the standard protocol without intravenous  contrast. Multiplanar CT image reconstructions of the cervical spine were also generated.  COMPARISON:  None.  FINDINGS: CT HEAD FINDINGS  No acute cortical infarct, hemorrhage, or mass lesion is present. The ventricles are of normal size. And no significant extra-axial fluid collection is present.  A comminuted right nasal bone  fractures appear remote. There is no significant soft tissue swelling associated. Please correlate with point tenderness. The osseous skull is otherwise intact. The paranasal sinuses and mastoid air cells are clear. No significant extracranial soft tissue injury is evident.  CT CERVICAL SPINE FINDINGS  The cervical spine is imaged from the skull base through T1-2. Vertebral body heights and alignment are maintained. No acute fracture or traumatic subluxation is evident. The lung apices are clear.  IMPRESSION: 1. No acute intracranial abnormality. 2. Comminuted right nasal bone fractures appear remote. 3. Negative CT of the cervical spine.   Electronically Signed   By: Lawrence Santiago M.D.   On: 03/08/2013 12:23   Ct Chest W Contrast  03/08/2013   CLINICAL DATA:  28 year old male status post 25 foot fall. Positive loss of consciousness. Pain. Back pain. Initial encounter.  EXAM: CT CHEST, ABDOMEN, AND PELVIS WITH CONTRAST  TECHNIQUE: Multidetector CT imaging of the chest, abdomen and pelvis was performed following the standard protocol during bolus administration of intravenous contrast.  CONTRAST:  164m OMNIPAQUE IOHEXOL 300 MG/ML  SOLN  COMPARISON:  Cervical spine CT from the same day reported separately.  FINDINGS: CT CHEST FINDINGS  Major airways are patent. No pneumothorax. No abnormal pulmonary opacity. No pericardial or pleural effusion. Negative soft tissues at the thoracic inlet. No mediastinal hematoma. Major mediastinal vascular structures appear within normal limits.  No superficial chest wall soft tissue injury identified. Bone mineralization is within normal limits. Clavicles intact. Sternum intact. No rib fracture identified. Visible shoulder osseous structures including the scapula appear intact. Paraspinal soft tissues appear within normal limits. No thoracic vertebral fracture identified.  CT ABDOMEN AND PELVIS FINDINGS  Normal lumbar segmentation.  Lumbar spine intact.  Nondisplaced left sacral  ala fracture (series 2, image 101). Otherwise the sacrum appears intact. SI joints intact. Subtle nondisplaced left pelvic fracture at the confluence of the acetabulum and superior pubic ramus, see series 2, image 117 and coronal images 33-36. Pubic rami intact. Proximal left femur appears intact except for a an unusual linear hypodensity in the left femoral head, without definite associated cortical disruption (see series 2, image 121). Elsewhere the pelvis and proximal femurs appear intact.  No pelvic free fluid. Negative distal colon. Unremarkable bladder. Negative sigmoid and left colon. Negative transverse colon.  In the right lower quadrant there is trace free fluid adjacent to the cecum. Evidence of a normal retrocecal appendix. No cecal wall thickening. Fluid-filled but otherwise negative terminal ileum. No dilated small bowel. Negative stomach and duodenum.  No pneumoperitoneum. No abdominal free fluid. Liver, gallbladder, spleen, pancreas, adrenal glands, and kidneys are intact. Portal venous system and major arterial structures in the abdomen and pelvis appear within normal limits. Evidence of a previous right inguinal hernia repair.  IMPRESSION: 1. Subtle nondisplaced left acetabular and left sacral ala fractures. 1 usual linear lucency through the left femoral head (series 2, image 121), but no definite proximal left femur fracture. 2. Small volume free fluid in the right lower quadrant. This is abnormal in a male patient but nonspecific. Bowel injury can be occult on initial trauma CT scans. 3. No acute traumatic injury identified in the chest.   Electronically Signed  By: Lars Pinks M.D.   On: 03/08/2013 12:42   Ct Cervical Spine Wo Contrast  03/08/2013   CLINICAL DATA:  25 foot fall with loss of consciousness. Right-sided arm pain.  EXAM: CT HEAD WITHOUT CONTRAST  CT CERVICAL SPINE WITHOUT CONTRAST  TECHNIQUE: Multidetector CT imaging of the head and cervical spine was performed following the  standard protocol without intravenous contrast. Multiplanar CT image reconstructions of the cervical spine were also generated.  COMPARISON:  None.  FINDINGS: CT HEAD FINDINGS  No acute cortical infarct, hemorrhage, or mass lesion is present. The ventricles are of normal size. And no significant extra-axial fluid collection is present.  A comminuted right nasal bone fractures appear remote. There is no significant soft tissue swelling associated. Please correlate with point tenderness. The osseous skull is otherwise intact. The paranasal sinuses and mastoid air cells are clear. No significant extracranial soft tissue injury is evident.  CT CERVICAL SPINE FINDINGS  The cervical spine is imaged from the skull base through T1-2. Vertebral body heights and alignment are maintained. No acute fracture or traumatic subluxation is evident. The lung apices are clear.  IMPRESSION: 1. No acute intracranial abnormality. 2. Comminuted right nasal bone fractures appear remote. 3. Negative CT of the cervical spine.   Electronically Signed   By: Lawrence Santiago M.D.   On: 03/08/2013 12:23   Ct Abdomen Pelvis W Contrast  03/08/2013   CLINICAL DATA:  28 year old male status post 25 foot fall. Positive loss of consciousness. Pain. Back pain. Initial encounter.  EXAM: CT CHEST, ABDOMEN, AND PELVIS WITH CONTRAST  TECHNIQUE: Multidetector CT imaging of the chest, abdomen and pelvis was performed following the standard protocol during bolus administration of intravenous contrast.  CONTRAST:  120m OMNIPAQUE IOHEXOL 300 MG/ML  SOLN  COMPARISON:  Cervical spine CT from the same day reported separately.  FINDINGS: CT CHEST FINDINGS  Major airways are patent. No pneumothorax. No abnormal pulmonary opacity. No pericardial or pleural effusion. Negative soft tissues at the thoracic inlet. No mediastinal hematoma. Major mediastinal vascular structures appear within normal limits.  No superficial chest wall soft tissue injury identified. Bone  mineralization is within normal limits. Clavicles intact. Sternum intact. No rib fracture identified. Visible shoulder osseous structures including the scapula appear intact. Paraspinal soft tissues appear within normal limits. No thoracic vertebral fracture identified.  CT ABDOMEN AND PELVIS FINDINGS  Normal lumbar segmentation.  Lumbar spine intact.  Nondisplaced left sacral ala fracture (series 2, image 101). Otherwise the sacrum appears intact. SI joints intact. Subtle nondisplaced left pelvic fracture at the confluence of the acetabulum and superior pubic ramus, see series 2, image 117 and coronal images 33-36. Pubic rami intact. Proximal left femur appears intact except for a an unusual linear hypodensity in the left femoral head, without definite associated cortical disruption (see series 2, image 121). Elsewhere the pelvis and proximal femurs appear intact.  No pelvic free fluid. Negative distal colon. Unremarkable bladder. Negative sigmoid and left colon. Negative transverse colon.  In the right lower quadrant there is trace free fluid adjacent to the cecum. Evidence of a normal retrocecal appendix. No cecal wall thickening. Fluid-filled but otherwise negative terminal ileum. No dilated small bowel. Negative stomach and duodenum.  No pneumoperitoneum. No abdominal free fluid. Liver, gallbladder, spleen, pancreas, adrenal glands, and kidneys are intact. Portal venous system and major arterial structures in the abdomen and pelvis appear within normal limits. Evidence of a previous right inguinal hernia repair.  IMPRESSION: 1. Subtle nondisplaced left acetabular and  left sacral ala fractures. 1 usual linear lucency through the left femoral head (series 2, image 121), but no definite proximal left femur fracture. 2. Small volume free fluid in the right lower quadrant. This is abnormal in a male patient but nonspecific. Bowel injury can be occult on initial trauma CT scans. 3. No acute traumatic injury  identified in the chest.   Electronically Signed   By: Lars Pinks M.D.   On: 03/08/2013 12:42    Review of Systems  Constitutional: Negative.   HENT: Negative.   Eyes: Negative.   Respiratory: Negative.   Cardiovascular: Negative.   Gastrointestinal: Negative for nausea, vomiting and abdominal pain.  Genitourinary: Negative.   Musculoskeletal:       See history of present illness  Skin: Negative.   Neurological: Positive for loss of consciousness. Negative for tingling, tremors, sensory change and speech change.  Endo/Heme/Allergies: Negative.   Psychiatric/Behavioral: Negative.     Blood pressure 112/67, pulse 113, temperature 97.7 F (36.5 C), temperature source Oral, resp. rate 20, height 6' 1"  (1.854 m), weight 160 lb (72.576 kg), SpO2 98.00%. Physical Exam  Constitutional: He is oriented to person, place, and time. He appears well-developed and well-nourished. No distress.  HENT:  Head: Normocephalic.  Nose: Nose normal.  Mouth/Throat: Oropharynx is clear and moist. No oropharyngeal exudate.  Eyes: EOM are normal. Pupils are equal, round, and reactive to light. Right eye exhibits no discharge. Left eye exhibits no discharge. No scleral icterus.  Neck: Normal range of motion. Neck supple. No tracheal deviation present.  No posterior midline tenderness, mild left lateral muscular tenderness  Cardiovascular: Normal rate, regular rhythm, normal heart sounds and intact distal pulses.   No murmur heard. Respiratory: Effort normal and breath sounds normal. No stridor. No respiratory distress. He has no wheezes. He has no rales. He exhibits no tenderness.  Tender over left sacrum  GI: Soft. He exhibits no distension. There is tenderness. There is no rebound and no guarding.  Mild tenderness along lower bilateral lower quadrants, left greater than right, pelvis tender to gentle compression  Genitourinary: Penis normal.  Musculoskeletal:  Old scar left knee, moves all extremities  well, however movement of left leg causes pelvic pain, right elbow is in a splint, move his right fingers well  Neurological: He is alert and oriented to person, place, and time. He displays no atrophy and no tremor. No cranial nerve deficit or sensory deficit. He exhibits normal muscle tone. He displays no seizure activity. GCS eye subscore is 4. GCS verbal subscore is 5. GCS motor subscore is 6.  MAE, Left lower extremity proximal strength test limited by pain, light touch intact right hand  Skin: Skin is warm.  Psychiatric: He has a normal mood and affect.     Assessment/Plan Status post fall with: 1. TBI/concussion 2. Right radial head fracture, left acetabular fracture, left sacral ala fracture - Dr. Marcelino Scot to see in a.m. He has reviewed his films. We will await weightbearing restrictions and plan to order therapies in the morning 3. Trace abdominal free fluid - allow clears, followup abdominal exams and labs in a.m. Admit to trauma Plan was discussed in detail the patient and his wife  Zenovia Jarred 03/09/2013, 12:31 AM

## 2013-03-09 NOTE — Progress Notes (Signed)
Rept to Dr. Lindie SpruceWyatt. Pt sitting on edge of bed fully dressed stating, "I am going home". MD to come see pt.

## 2013-03-10 ENCOUNTER — Telehealth (HOSPITAL_COMMUNITY): Payer: Self-pay | Admitting: Emergency Medicine

## 2013-03-10 ENCOUNTER — Telehealth (INDEPENDENT_AMBULATORY_CARE_PROVIDER_SITE_OTHER): Payer: Self-pay

## 2013-03-10 ENCOUNTER — Encounter (HOSPITAL_COMMUNITY): Payer: Self-pay | Admitting: Respiratory Therapy

## 2013-03-10 NOTE — Telephone Encounter (Signed)
[facility] called stating pt has arrived at pharmacy with another rx for oxycodone #30. Pt had percocet #30 filled yesterday. Pharmacy filled the percocet yesterday. Pharmacy states that both rx was written 03-09-12 by same MD. They decline filling the second oxycodone rx at this time. I advised pharmacy I will let the Md know.

## 2013-03-10 NOTE — Telephone Encounter (Signed)
Called another number in the chart without success:  [phone].

## 2013-03-10 NOTE — Telephone Encounter (Signed)
I called the number listed and it says "this number is not in use".  Would be happy to call the patient back when I get the correct phone number.

## 2013-03-10 NOTE — Consult Note (Signed)
I saw and examined the patient with [name], communicating the findings and plan noted above.  Right radial head fracture will likely need surgery; patient leans toward doing this as an outpatient.  Myrene GalasMichael Yaffa Seckman, MD [facility] [phone] (p)

## 2013-03-10 NOTE — Consult Note (Signed)
Please see my note on other Brief Consultation record. I saw and examined the patient with Mr. Paul, communicating the findings and plan noted above.   Ruthann Angulo, MD [facility] [phone] (p)  

## 2013-03-10 NOTE — Telephone Encounter (Signed)
Reviewed with Dr Lindie SpruceWyatt. Dr Lindie SpruceWyatt requests both rx be filled. Pt was instructed in rotating the medication. [facility] advised and will fill the oxycodone also. Pt is aware and will pick up rx today.

## 2013-03-14 ENCOUNTER — Encounter (HOSPITAL_COMMUNITY)
Admission: RE | Admit: 2013-03-14 | Discharge: 2013-03-14 | Disposition: A | Discharge status: Home / Self Care | Payer: Medicaid Other | Source: Ambulatory Visit | Attending: Orthopedic Surgery | Admitting: Orthopedic Surgery

## 2013-03-14 ENCOUNTER — Other Ambulatory Visit (HOSPITAL_COMMUNITY): Payer: Self-pay | Admitting: *Deleted

## 2013-03-14 ENCOUNTER — Encounter (HOSPITAL_COMMUNITY): Payer: Self-pay

## 2013-03-14 HISTORY — DX: Other specified health status: Z78.9

## 2013-03-14 LAB — TYPE AND SCREEN
ABO/RH(D): A POS
Antibody Screen: NEGATIVE

## 2013-03-14 LAB — CBC WITH DIFFERENTIAL/PLATELET
BASOS ABS: 0 10*3/uL (ref 0.0–0.1)
BASOS PCT: 1 % (ref 0–1)
EOS ABS: 0.3 10*3/uL (ref 0.0–0.7)
EOS PCT: 3 % (ref 0–5)
HCT: 42.6 % (ref 39.0–52.0)
Hemoglobin: 15 g/dL (ref 13.0–17.0)
LYMPHS PCT: 33 % (ref 12–46)
Lymphs Abs: 2.7 10*3/uL (ref 0.7–4.0)
MCH: 31.1 pg (ref 26.0–34.0)
MCHC: 35.2 g/dL (ref 30.0–36.0)
MCV: 88.2 fL (ref 78.0–100.0)
Monocytes Absolute: 0.5 10*3/uL (ref 0.1–1.0)
Monocytes Relative: 7 % (ref 3–12)
Neutro Abs: 4.7 10*3/uL (ref 1.7–7.7)
Neutrophils Relative %: 57 % (ref 43–77)
PLATELETS: 215 10*3/uL (ref 150–400)
RBC: 4.83 MIL/uL (ref 4.22–5.81)
RDW: 12.7 % (ref 11.5–15.5)
WBC: 8.1 10*3/uL (ref 4.0–10.5)

## 2013-03-14 LAB — COMPREHENSIVE METABOLIC PANEL
ALT: 41 U/L (ref 0–53)
AST: 54 U/L — ABNORMAL HIGH (ref 0–37)
Albumin: 3.8 g/dL (ref 3.5–5.2)
Alkaline Phosphatase: 39 U/L (ref 39–117)
BUN: 12 mg/dL (ref 6–23)
CALCIUM: 9 mg/dL (ref 8.4–10.5)
CO2: 31 mEq/L (ref 19–32)
CREATININE: 0.84 mg/dL (ref 0.50–1.35)
Chloride: 107 mEq/L (ref 96–112)
GFR calc Af Amer: >90 mL/min (ref >90)
GFR calc non Af Amer: >90 mL/min (ref >90)
Glucose, Bld: 67 mg/dL — ABNORMAL LOW (ref 70–99)
Potassium: 4.6 mEq/L (ref 3.7–5.3)
SODIUM: 147 meq/L (ref 137–147)
TOTAL PROTEIN: 6.9 g/dL (ref 6.0–8.3)
Total Bilirubin: 0.2 mg/dL — ABNORMAL LOW (ref 0.3–1.2)

## 2013-03-14 LAB — PROTIME-INR
INR: 1.04 (ref 0.00–1.49)
Prothrombin Time: 13.4 seconds (ref 11.6–15.2)

## 2013-03-14 LAB — ABO/RH: ABO/RH(D): A POS

## 2013-03-14 MED ORDER — ACETAMINOPHEN 500 MG PO TABS
1000.0000 mg | ORAL_TABLET | Freq: Once | ORAL | Status: AC
  Administered 2013-03-15: 1000 mg via ORAL
  Filled 2013-03-14: qty 2

## 2013-03-14 MED ORDER — CEFAZOLIN SODIUM-DEXTROSE 2-3 GM-% IV SOLR
2.0000 g | INTRAVENOUS | Status: AC
  Administered 2013-03-15: 2 g via INTRAVENOUS
  Filled 2013-03-14: qty 50

## 2013-03-14 MED ORDER — CHLORHEXIDINE GLUCONATE 4 % EX LIQD: 60.0000 mL | Freq: Once | CUTANEOUS | Status: DC

## 2013-03-14 NOTE — Progress Notes (Signed)
Called and requested pre-op orders from Dr. Magdalene PatriciaHandy's office. Spoke with AGCO CorporationChristy.

## 2013-03-14 NOTE — Interval H&P Note (Signed)
History and Physical Interval Note:  No interval changes from previous exam. Pt now presents for R radial head arthroplasty vs ORIF  Anticipate overnight stay  Early initiation of ROM R elbow   03/14/2013 7:30 PM  Clinton BoydenEric A Jarnagin  has presented today for surgery, with the diagnosis of right radial head fracture  The various methods of treatment have been discussed with the patient and family. After consideration of risks, benefits and other options for treatment, the patient has consented to  Procedure(s): RIGHT RADIAL HEAD ARTHROPLASTY (Right) as a surgical intervention .  The patient's history has been reviewed, patient examined, no change in status, stable for surgery.  I have reviewed the patient's chart and labs.  Questions were answered to the patient's satisfaction.    Mearl LatinKeith W. Geovannie Vilar, PA-C [facility] [phone] (P)

## 2013-03-14 NOTE — H&P (View-Only) (Signed)
NAME:  Clinton Evans, Clinton Evans                 ACCOUNT NO.:  [id number]  MEDICAL RECORD NO.:  [id number]  LOCATION:  5N10C                        FACILITY:  [facility]  PHYSICIAN:  Doralee AlbinoMichael H. Carola FrostHandy, M.D. DATE OF BIRTH:  [birth year]  DATE OF CONSULTATION:  03/09/2013 DATE OF DISCHARGE:                                CONSULTATION   REQUESTING SERVICE:  Gabrielle DareBurke E. Janee Mornhompson, MD, Trauma Service.  REASON FOR CONSULTATION:  Fall with a pelvic ring fracture and right radial head fracture.  HISTORY OF PRESENT ILLNESS:  Clinton Evans is a very pleasant [age] year old, white male, who is approximately 25 feet up in a tree cutting limbs when his cleat gave way and his harness failed.  He again fell approximately 25 feet landing on the dirt.  He believes he landed on his right side, but is unsure.  He did have a brief loss of consciousness.  He was brought to [facility] where he was initially evaluated, where he was found to have left pelvic ring fracture, as well as a right radial head fracture.  In addition, CT scan also showed some free fluid in his abdomen and as such, he was transferred to the [facility] for the Trauma Service for continued observation.  The patient arrived early this morning, was complaining of some persistent left-sided pelvic pain as well as some mild right elbow pain.  He was splinted with a coaptation splint to the right arm.  Currently, he is in [facility] [place] [facility], again he complains of right elbow pain as well as some left-sided pelvic pain which he states is difficult to bear weight on.  He denied any numbness or tingling.  Denies any additional injuries elsewhere.  No chest pain.  No shortness of breath.  No abdominal pain.  No other issues of note.  The patient denies any headaches.  No visual changes. No neck pain.  PAST MEDICAL HISTORY:  The patient denies.  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  FAMILY HISTORY:  Noncontributory.  SOCIAL  HISTORY:  Patient smokes about a pack a day.  He is social drinker.  Does not do any other drugs, has 2 small children.  He is married.  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  ALLERGIES:  No known drug allergies.  MEDICATIONS:  Prior to admission, Advil.  CURRENT LABS:  Hemoglobin 13.7, hematocrit 40.2, platelets 140.  Sodium 138, potassium 4.0, chloride 105, bicarb 23, BUN 8, creatinine 0.84, calcium 8.1, glucose 86.  REVIEW OF SYSTEMS:  As noted above in the HPI.  PHYSICAL EXAMINATION:  VITAL SIGNS:  Temperature 97.7, heart rate 113, BP is 112/67, 98% on room air. GENERAL:  Patient is very pleasant white male, oriented to person, place, and time.  Does not appear to be any acute distress. HEENT:  Head is atraumatic.  Extraocular muscles are intact.  Moist mucous membranes are noted.  NECK:  Supple.  No tenderness with palpation. CARDIAC:  S1, S2. ABDOMEN:  Soft, nontender.  Positive bowel sounds. PELVIS:  No gross instability, mild discomfort with lateral compression to the left hemipelvis.  No pain with palpation over his pubic symphysis. EXTREMITIES:  Right upper extremity, patient is in a sugar tong splint to forearm. His clavicle, shoulder, and upper arm are unremarkable.  Upon removal of the splint, he has some swelling over his radial head and tenderness to palpation.  Radial, ulnar, median, axillary nerve, motor and sensory functions are grossly intact distally.  Extremities warm, palpable radial pulse.  He does have quite dirty hands.  Tattoo also noted to the right arm.  No pain with active motion of his shoulder, wrist, or hand. Again nontender to palpation, wrist or hand or shoulder.  Some discomfort with elbow motion, but tolerable.  Left upper extremity, unremarkable.  No acute findings.  Motor and sensory function intact. Bilateral lower extremities, hips, knees, and ankles are unremarkable. The patient demonstrates excellent range of motion involving  joints. Deep peroneal nerve, superficial peroneal nerve, tibial nerve, sensory function are intact bilaterally.  EHL, FHL, anterior tibialis, posterior tibialis, peroneals, gastrocsoleus complex, motor function are intact bilaterally.  Quadriceps and hamstring motor function grossly intact as well bilaterally.  The extremities are warm.  Palpable dorsalis pedis pulses appreciated bilaterally.  No pain with axial loading or logrolling of the hips bilaterally.  No significant swelling is appreciated.  Compartments are all soft and nontender.  He does have some discomfort with movement of his left leg which is referred to his pelvis.  Plain film of the right elbow demonstrates a relatively nondisplaced impacted right radial head and neck fracture.  CT of his abdomen and pelvis demonstrates a high superior pubic rami fracture, also a fracture of the left sacral ala.  There is also some lucency in the left femoral head, but I do not appreciate a true fracture line with disruption of the cortex anteriorly and posteriorly.  ASSESSMENT AND PLAN:  A 28 year old male status post fall from height. 1. Fall. 2. Traumatic brain injury with concussion per Trauma Service. 3. Left LC1 pelvic ring fracture.  We will check AP pelvis and inlet     outlet films for baseline prior to immobilization.  The pelvic ring     overall was stable and patient can weightbear as tolerated to pain     tolerance.  No range of motion restrictions.  We will continue to     follow accordingly and see how he does with therapy. 4. Right radial head fracture.  We will obtain a CT scan of this     fracture to fully evaluate to determine if surgical intervention is     needed.  The patient was taken out of his splint for comfort.  He     can be in a sling for comfort.  Ice and elevate as needed.  It is     okay for him to move his fingers, wrist, as well as elbow with     flexion and extension for the time being.  Once we have  the CT     scan back, we will then determine best course of action, and we     will make further recommendations for therapies. 5. Nicotine dependence.  We did discuss the negative effects of     nicotine on bone healing, the patient     demonstrates a full understanding of this and we will try to     decrease and cutback altogether. 6. Continue per Trauma Service.  Thank you for this consult.  We will continue to follow along.     Mearl Latin, PA   ______________________________ Doralee Albino. Carola Frost, M.D.  KWP/MEDQ  D:  03/09/2013  T:  03/09/2013  Job:  [id number]

## 2013-03-14 NOTE — H&P (View-Only) (Signed)
Please see my note on other Brief Consultation record. I saw and examined the patient with Mr. Renae Fickleaul, communicating the findings and plan noted above.   Myrene GalasMichael Aaliyha Mumford, MD [facility] [phone] (p)

## 2013-03-14 NOTE — Pre-Procedure Instructions (Signed)
Clinton Evans  03/14/2013   Your procedure is scheduled on:  Tuesday, March 15, 2013 at 12:20 PM.   Report to [facility] Entrance "A" Admitting Office at 10:20 AM.   Call this number if you have problems the morning of surgery: [phone]   Remember:   Do not eat food or drink liquids after midnight tonight, 03/14/13.   Take these medicines the morning of surgery with A SIP OF WATER: May take a pain pill if needed (not Ibuprofen).   Do not wear jewelry.  Do not wear lotions, powders, or cologne. You may wear deodorant.  Men may shave face and neck.  Do not bring valuables to the hospital.  [facility] is not responsible                  for any belongings or valuables.               Contacts, dentures or bridgework may not be worn into surgery.  Leave suitcase in the car. After surgery it may be brought to your room.  For patients admitted to the hospital, discharge time is determined by your                treatment team.               Special Instructions: Shower using CHG 2 nights before surgery and the night before surgery.  If you shower the day of surgery use CHG.  Use special wash - you have one bottle of CHG for all showers.  You should use approximately 1/3 of the bottle for each shower.   Please read over the following fact sheets that you were given: Pain Booklet, Coughing and Deep Breathing, Blood Transfusion Information and Surgical Site Infection Prevention

## 2013-03-15 ENCOUNTER — Ambulatory Visit (HOSPITAL_COMMUNITY): Payer: Medicaid Other | Admitting: Anesthesiology

## 2013-03-15 ENCOUNTER — Encounter (HOSPITAL_COMMUNITY): Payer: Medicaid Other | Admitting: Anesthesiology

## 2013-03-15 ENCOUNTER — Encounter (HOSPITAL_COMMUNITY): Payer: Self-pay

## 2013-03-15 ENCOUNTER — Ambulatory Visit (HOSPITAL_COMMUNITY)
Admission: RE | Admit: 2013-03-15 | Discharge: 2013-03-15 | Disposition: A | Discharge status: Home / Self Care | Payer: Medicaid Other | Source: Ambulatory Visit | Attending: Orthopedic Surgery | Admitting: Orthopedic Surgery

## 2013-03-15 ENCOUNTER — Encounter (HOSPITAL_COMMUNITY)
Admission: RE | Disposition: A | Discharge status: Home / Self Care | Payer: Self-pay | Source: Ambulatory Visit | Attending: Orthopedic Surgery

## 2013-03-15 ENCOUNTER — Ambulatory Visit (HOSPITAL_COMMUNITY): Payer: Medicaid Other

## 2013-03-15 DIAGNOSIS — Z01812 Encounter for preprocedural laboratory examination: Secondary | ICD-10-CM | POA: Insufficient documentation

## 2013-03-15 DIAGNOSIS — S52123A Displaced fracture of head of unspecified radius, initial encounter for closed fracture: Secondary | ICD-10-CM | POA: Insufficient documentation

## 2013-03-15 DIAGNOSIS — W1789XA Other fall from one level to another, initial encounter: Secondary | ICD-10-CM | POA: Insufficient documentation

## 2013-03-15 DIAGNOSIS — Z87820 Personal history of traumatic brain injury: Secondary | ICD-10-CM | POA: Insufficient documentation

## 2013-03-15 DIAGNOSIS — IMO0001 Reserved for inherently not codable concepts without codable children: Secondary | ICD-10-CM | POA: Insufficient documentation

## 2013-03-15 DIAGNOSIS — F17200 Nicotine dependence, unspecified, uncomplicated: Secondary | ICD-10-CM | POA: Insufficient documentation

## 2013-03-15 DIAGNOSIS — S52133A Displaced fracture of neck of unspecified radius, initial encounter for closed fracture: Secondary | ICD-10-CM | POA: Insufficient documentation

## 2013-03-15 HISTORY — PX: RADIAL HEAD ARTHROPLASTY: SHX6044

## 2013-03-15 SURGERY — ARTHROPLASTY, RADIUS, HEAD
Anesthesia: Regional | Site: Arm Lower | Laterality: Right

## 2013-03-15 MED ORDER — ONDANSETRON HCL 4 MG/2ML IJ SOLN
INTRAMUSCULAR | Status: DC | PRN
  Administered 2013-03-15: 4 mg via INTRAVENOUS

## 2013-03-15 MED ORDER — LACTATED RINGERS IV SOLN
INTRAVENOUS | Status: DC
  Administered 2013-03-15: 11:00:00 via INTRAVENOUS

## 2013-03-15 MED ORDER — FENTANYL CITRATE 0.05 MG/ML IJ SOLN
INTRAMUSCULAR | Status: AC
  Administered 2013-03-15: 100 ug via INTRAVENOUS
  Filled 2013-03-15: qty 2

## 2013-03-15 MED ORDER — PROMETHAZINE HCL 25 MG/ML IJ SOLN: 6.2500 mg | INTRAMUSCULAR | Status: DC | PRN

## 2013-03-15 MED ORDER — OXYCODONE HCL 5 MG PO TABS: 5.0000 mg | ORAL_TABLET | ORAL | Status: DC | PRN

## 2013-03-15 MED ORDER — OXYCODONE HCL 5 MG PO TABS
5.0000 mg | ORAL_TABLET | Freq: Once | ORAL | Status: AC | PRN
  Administered 2013-03-15: 5 mg via ORAL

## 2013-03-15 MED ORDER — ONDANSETRON HCL 4 MG PO TABS: 4.0000 mg | ORAL_TABLET | Freq: Three times a day (TID) | ORAL | Status: DC | PRN

## 2013-03-15 MED ORDER — LACTATED RINGERS IV SOLN
INTRAVENOUS | Status: DC | PRN
  Administered 2013-03-15: 12:00:00 via INTRAVENOUS

## 2013-03-15 MED ORDER — ARTIFICIAL TEARS OP OINT
TOPICAL_OINTMENT | OPHTHALMIC | Status: DC | PRN
  Administered 2013-03-15: 1 via OPHTHALMIC

## 2013-03-15 MED ORDER — MIDAZOLAM HCL 2 MG/2ML IJ SOLN
INTRAMUSCULAR | Status: AC
  Administered 2013-03-15: 2 mg via INTRAVENOUS
  Filled 2013-03-15: qty 2

## 2013-03-15 MED ORDER — PROPOFOL 10 MG/ML IV BOLUS
INTRAVENOUS | Status: DC | PRN
  Administered 2013-03-15: 50 mg via INTRAVENOUS
  Administered 2013-03-15: 200 mg via INTRAVENOUS

## 2013-03-15 MED ORDER — BUPIVACAINE-EPINEPHRINE PF 0.5-1:200000 % IJ SOLN
INTRAMUSCULAR | Status: DC | PRN
  Administered 2013-03-15: 150 mg via PERINEURAL

## 2013-03-15 MED ORDER — HYDROMORPHONE HCL PF 1 MG/ML IJ SOLN
INTRAMUSCULAR | Status: AC
  Filled 2013-03-15: qty 1

## 2013-03-15 MED ORDER — HYDROMORPHONE HCL PF 1 MG/ML IJ SOLN
0.2500 mg | INTRAMUSCULAR | Status: DC | PRN
  Administered 2013-03-15: 0.5 mg via INTRAVENOUS

## 2013-03-15 MED ORDER — SODIUM CHLORIDE 0.9 % IV SOLN
INTRAVENOUS | Status: DC | PRN
  Administered 2013-03-15: 14:00:00 via INTRAVENOUS

## 2013-03-15 MED ORDER — OXYCODONE HCL 5 MG PO TABS
ORAL_TABLET | ORAL | Status: AC
  Filled 2013-03-15: qty 1

## 2013-03-15 MED ORDER — 0.9 % SODIUM CHLORIDE (POUR BTL) OPTIME
TOPICAL | Status: DC | PRN
  Administered 2013-03-15: 1000 mL

## 2013-03-15 MED ORDER — OXYCODONE HCL 5 MG/5ML PO SOLN: 5.0000 mg | Freq: Once | ORAL | Status: AC | PRN

## 2013-03-15 MED ORDER — OXYCODONE-ACETAMINOPHEN 5-325 MG PO TABS: 1.0000 | ORAL_TABLET | Freq: Four times a day (QID) | ORAL | Status: DC | PRN

## 2013-03-15 MED ORDER — FENTANYL CITRATE 0.05 MG/ML IJ SOLN
INTRAMUSCULAR | Status: DC | PRN
  Administered 2013-03-15: 100 ug via INTRAVENOUS
  Administered 2013-03-15 (×2): 50 ug via INTRAVENOUS

## 2013-03-15 MED ORDER — METHOCARBAMOL 500 MG PO TABS: 500.0000 mg | ORAL_TABLET | Freq: Four times a day (QID) | ORAL | Status: DC | PRN

## 2013-03-15 MED ORDER — DEXAMETHASONE SODIUM PHOSPHATE 10 MG/ML IJ SOLN
INTRAMUSCULAR | Status: DC | PRN
  Administered 2013-03-15: 6 mg

## 2013-03-15 SURGICAL SUPPLY — 83 items
APL SKNCLS STERI-STRIP NONHPOA (GAUZE/BANDAGES/DRESSINGS)
BANDAGE ELASTIC 3 VELCRO ST LF (GAUZE/BANDAGES/DRESSINGS) ×2 IMPLANT
BANDAGE ELASTIC 4 VELCRO ST LF (GAUZE/BANDAGES/DRESSINGS) ×2 IMPLANT
BANDAGE ELASTIC 6 VELCRO ST LF (GAUZE/BANDAGES/DRESSINGS) ×1 IMPLANT
BANDAGE GAUZE ELAST BULKY 4 IN (GAUZE/BANDAGES/DRESSINGS) ×2 IMPLANT
BENZOIN TINCTURE PRP APPL 2/3 (GAUZE/BANDAGES/DRESSINGS) IMPLANT
BLADE AVERAGE 25X9 (BLADE) ×2 IMPLANT
BLADE SURG 10 STRL SS (BLADE) ×1 IMPLANT
BLADE SURG ROTATE 9660 (MISCELLANEOUS) ×2 IMPLANT
BNDG CMPR 9X4 STRL LF SNTH (GAUZE/BANDAGES/DRESSINGS) ×1
BNDG COHESIVE 4X5 TAN STRL (GAUZE/BANDAGES/DRESSINGS) ×2 IMPLANT
BNDG ESMARK 4X9 LF (GAUZE/BANDAGES/DRESSINGS) ×2 IMPLANT
BRUSH SCRUB DISP (MISCELLANEOUS) ×4 IMPLANT
CLEANER TIP ELECTROSURG 2X2 (MISCELLANEOUS) ×2 IMPLANT
CLOTH BEACON ORANGE TIMEOUT ST (SAFETY) ×2 IMPLANT
COVER SURGICAL LIGHT HANDLE (MISCELLANEOUS) ×3 IMPLANT
CUFF TOURNIQUET SINGLE 18IN (TOURNIQUET CUFF) ×1 IMPLANT
CUFF TOURNIQUET SINGLE 24IN (TOURNIQUET CUFF) IMPLANT
DECANTER SPIKE VIAL GLASS SM (MISCELLANEOUS) IMPLANT
DRAPE C-ARM 42X72 X-RAY (DRAPES) ×1 IMPLANT
DRAPE C-ARMOR (DRAPES) ×2 IMPLANT
DRAPE INCISE IOBAN 66X45 STRL (DRAPES) IMPLANT
DRAPE U-SHAPE 47X51 STRL (DRAPES) ×2 IMPLANT
DRSG ADAPTIC 3X8 NADH LF (GAUZE/BANDAGES/DRESSINGS) IMPLANT
DRSG EMULSION OIL 3X3 NADH (GAUZE/BANDAGES/DRESSINGS) ×1 IMPLANT
ELECT REM PT RETURN 9FT ADLT (ELECTROSURGICAL) ×2
ELECTRODE REM PT RTRN 9FT ADLT (ELECTROSURGICAL) ×1 IMPLANT
FACESHIELD LNG OPTICON STERILE (SAFETY) IMPLANT
GAUZE XEROFORM 1X8 LF (GAUZE/BANDAGES/DRESSINGS) ×1 IMPLANT
GAUZE XEROFORM 5X9 LF (GAUZE/BANDAGES/DRESSINGS) ×1 IMPLANT
GLOVE BIO SURGEON STRL SZ7.5 (GLOVE) ×2 IMPLANT
GLOVE BIO SURGEON STRL SZ8 (GLOVE) ×2 IMPLANT
GLOVE BIOGEL PI IND STRL 7.5 (GLOVE) ×1 IMPLANT
GLOVE BIOGEL PI IND STRL 8 (GLOVE) ×1 IMPLANT
GLOVE BIOGEL PI INDICATOR 7.5 (GLOVE) ×1
GLOVE BIOGEL PI INDICATOR 8 (GLOVE) ×1
GOWN PREVENTION PLUS XLARGE (GOWN DISPOSABLE) ×1 IMPLANT
GOWN STRL NON-REIN LRG LVL3 (GOWN DISPOSABLE) ×2 IMPLANT
GOWN STRL REUS W/ TWL LRG LVL3 (GOWN DISPOSABLE) IMPLANT
GOWN STRL REUS W/ TWL XL LVL3 (GOWN DISPOSABLE) IMPLANT
GOWN STRL REUS W/TWL 2XL LVL3 (GOWN DISPOSABLE) ×1 IMPLANT
GOWN STRL REUS W/TWL LRG LVL3 (GOWN DISPOSABLE) ×4
GOWN STRL REUS W/TWL XL LVL3 (GOWN DISPOSABLE) ×2
HEAD EXPLOR 10X24MM (Head) ×1 IMPLANT
KIT BASIN OR (CUSTOM PROCEDURE TRAY) ×2 IMPLANT
KIT ROOM TURNOVER OR (KITS) ×2 IMPLANT
MANIFOLD NEPTUNE II (INSTRUMENTS) ×2 IMPLANT
NDL 1/2 CIR CATGUT .05X1.09 (NEEDLE) ×1 IMPLANT
NEEDLE 1/2 CIR CATGUT .05X1.09 (NEEDLE) ×2 IMPLANT
NS IRRIG 1000ML POUR BTL (IV SOLUTION) ×2 IMPLANT
PACK ORTHO EXTREMITY (CUSTOM PROCEDURE TRAY) ×2 IMPLANT
PAD ABD 8X10 STRL (GAUZE/BANDAGES/DRESSINGS) ×1 IMPLANT
PAD ARMBOARD 7.5X6 YLW CONV (MISCELLANEOUS) ×4 IMPLANT
PAD CAST 4YDX4 CTTN HI CHSV (CAST SUPPLIES) ×1 IMPLANT
PADDING CAST COTTON 4X4 STRL (CAST SUPPLIES)
PASSER SUT SWANSON 36MM LOOP (INSTRUMENTS) ×1 IMPLANT
SPLINT PLASTER CAST XFAST 5X30 (CAST SUPPLIES) ×1 IMPLANT
SPLINT PLASTER XFAST SET 5X30 (CAST SUPPLIES)
SPONGE GAUZE 4X4 12PLY (GAUZE/BANDAGES/DRESSINGS) ×1 IMPLANT
SPONGE GAUZE 4X4 12PLY STER LF (GAUZE/BANDAGES/DRESSINGS) ×1 IMPLANT
SPONGE LAP 18X18 X RAY DECT (DISPOSABLE) ×3 IMPLANT
SPONGE SCRUB IODOPHOR (GAUZE/BANDAGES/DRESSINGS) ×1 IMPLANT
STAPLER VISISTAT 35W (STAPLE) ×2 IMPLANT
STEM W/SCREW 9X38 (Stem) ×1 IMPLANT
STRIP CLOSURE SKIN 1/2X4 (GAUZE/BANDAGES/DRESSINGS) IMPLANT
SUCTION FRAZIER TIP 10 FR DISP (SUCTIONS) ×2 IMPLANT
SUT ETHILON 3 0 PS 1 (SUTURE) ×1 IMPLANT
SUT FIBERWIRE #2 38 T-5 BLUE (SUTURE)
SUT PDS AB 2-0 CT1 27 (SUTURE) IMPLANT
SUT PROLENE 3 0 PS 2 (SUTURE) ×2 IMPLANT
SUT VIC AB 0 CT1 27 (SUTURE) ×2
SUT VIC AB 0 CT1 27XBRD ANBCTR (SUTURE) ×2 IMPLANT
SUT VIC AB 2-0 CT1 27 (SUTURE) ×2
SUT VIC AB 2-0 CT1 TAPERPNT 27 (SUTURE) ×2 IMPLANT
SUT VIC AB 2-0 CT3 27 (SUTURE) IMPLANT
SUTURE FIBERWR #2 38 T-5 BLUE (SUTURE) IMPLANT
SYR CONTROL 10ML LL (SYRINGE) ×1 IMPLANT
TOWEL OR 17X24 6PK STRL BLUE (TOWEL DISPOSABLE) IMPLANT
TOWEL OR 17X26 10 PK STRL BLUE (TOWEL DISPOSABLE) ×4 IMPLANT
TUBE CONNECTING 12X1/4 (SUCTIONS) ×2 IMPLANT
UNDERPAD 30X30 INCONTINENT (UNDERPADS AND DIAPERS) ×3 IMPLANT
WATER STERILE IRR 1000ML POUR (IV SOLUTION) ×1 IMPLANT
YANKAUER SUCT BULB TIP NO VENT (SUCTIONS) ×2 IMPLANT

## 2013-03-15 NOTE — Progress Notes (Signed)
Orthopedic Tech Progress Note Patient Details:  Clinton Evans 06/19/1985 [id number] Arm sling delivered to PACU to be applied once patient is dressed to go home.  Ortho Devices Type of Ortho Device: Arm sling Ortho Device/Splint Interventions: Ordered   [place] R Thompson 03/15/2013, 3:40 PM

## 2013-03-15 NOTE — Anesthesia Preprocedure Evaluation (Addendum)
Anesthesia Evaluation  Patient identified by MRN, date of birth, ID band Patient awake    Reviewed: Allergy & Precautions, H&P , NPO status , Patient's Chart, lab work & pertinent test results  History of Anesthesia Complications Negative for: history of anesthetic complications  Airway Mallampati: II TM Distance: >3 FB Neck ROM: full    Dental  (+) Teeth Intact and Dental Advidsory Given   Pulmonary neg pulmonary ROS, Current Smoker,  breath sounds clear to auscultation        Cardiovascular negative cardio ROS  Rhythm:regular Rate:Normal     Neuro/Psych negative neurological ROS  negative psych ROS   GI/Hepatic negative GI ROS, Neg liver ROS,   Endo/Other  negative endocrine ROS  Renal/GU negative Renal ROS     Musculoskeletal   Abdominal   Peds  Hematology   Anesthesia Other Findings   Reproductive/Obstetrics negative OB ROS                          Anesthesia Physical Anesthesia Plan  ASA: II  Anesthesia Plan: General LMA and General   Post-op Pain Management: MAC Combined w/ Regional for Post-op pain   Induction:   Airway Management Planned:   Additional Equipment:   Intra-op Plan:   Post-operative Plan: Extubation in OR  Informed Consent: I have reviewed the patients History and Physical, chart, labs and discussed the procedure including the risks, benefits and alternatives for the proposed anesthesia with the patient or authorized representative who has indicated his/her understanding and acceptance.   Dental Advisory Given and Dental advisory given  Plan Discussed with: Anesthesiologist, CRNA and Surgeon  Anesthesia Plan Comments:        Anesthesia Quick Evaluation

## 2013-03-15 NOTE — Anesthesia Procedure Notes (Addendum)
Anesthesia Regional Block:  Supraclavicular block  Pre-Anesthetic Checklist: ,, timeout performed, Correct Patient, Correct Site, Correct Laterality, Correct Procedure, Correct Position, site marked, Risks and benefits discussed,  Surgical consent,  Pre-op evaluation,  At surgeon's request and post-op pain management  Laterality: Right  Prep: chloraprep       Needles:  Injection technique: Single-shot  Needle Type: Echogenic Stimulator Needle     Needle Length: 5cm 5 cm Needle Gauge: 22 and 22 G    Additional Needles:  Procedures: ultrasound guided (picture in chart) and nerve stimulator Supraclavicular block  Nerve Stimulator or Paresthesia:  Response: bicep contraction, 0.45 mA,   Additional Responses:   Narrative:  Start time: 03/15/2013 11:42 AM End time: 03/15/2013 11:52 AM Injection made incrementally with aspirations every 5 mL.  Performed by: Personally  Anesthesiologist: J. Adonis Hugueninan Shaneta Cervenka, MD  Additional Notes: Functioning IV was confirmed and monitors applied.  A 50mm 22ga echogenic arrow stimulator was used. Sterile prep and drape,hand hygiene and sterile gloves were used.Ultrasound guidance: relevant anatomy identified, needle position confirmed, local anesthetic spread visualized around nerve(s)., vascular puncture avoided.  Image printed for medical record.  Negative aspiration and negative test dose prior to incremental administration of local anesthetic. The patient tolerated the procedure well.

## 2013-03-15 NOTE — Anesthesia Postprocedure Evaluation (Signed)
Anesthesia Post Note  Patient: Clinton Evans  Procedure(s) Performed: Procedure(s) (LRB): RIGHT RADIAL HEAD ARTHROPLASTY (Right)  Anesthesia type: general  Patient location: PACU  Post pain: Pain level controlled  Post assessment: Patient's Cardiovascular Status Stable  Last Vitals:  Filed Vitals:   03/15/13 1545  BP:   Pulse: 54  Temp:   Resp: 11    Post vital signs: Reviewed and stable  Level of consciousness: sedated  Complications: No apparent anesthesia complications

## 2013-03-15 NOTE — Progress Notes (Signed)
Preop Note  I have seen and examined the patient. I agree with the findings above.  I discussed with the patient the risks and benefits of surgery for right radial fracture, including the possibility of prosthetic loosening, infection, nerve injury, vessel injury, wound breakdown, arthritis, symptomatic hardware, DVT/ PE, loss of motion, and need for further surgery among others.  He understood these risks and wished to proceed.   Budd PalmerHANDY,Barry Faircloth H, MD 03/15/2013 12:12 PM

## 2013-03-15 NOTE — Progress Notes (Signed)
Report given to angel rn as caregiver 

## 2013-03-15 NOTE — Transfer of Care (Signed)
Immediate Anesthesia Transfer of Care Note  Patient: Clinton BoydenEric A Peacock  Procedure(s) Performed: Procedure(s): RIGHT RADIAL HEAD ARTHROPLASTY (Right)  Patient Location: PACU  Anesthesia Type:General  Level of Consciousness: awake, alert  and oriented  Airway & Oxygen Therapy: Patient Spontanous Breathing  Post-op Assessment: Report given to PACU RN  Post vital signs: stable  Complications: No apparent anesthesia complications

## 2013-03-15 NOTE — Brief Op Note (Signed)
03/15/2013  2:45 PM  PATIENT:  Clinton Evans  28 y.o. male  PRE-OPERATIVE DIAGNOSIS:  right radial head fracture  POST-OPERATIVE DIAGNOSIS:   1. Right radial head fracture 2. Traumatic capitellar full thickness cartilage loss, 50%  PROCEDURE:  Procedure(s): RIGHT RADIAL HEAD ARTHROPLASTY (Right)  SURGEON:  Surgeon(s) and Role:    * Budd PalmerMichael H Amylynn Fano, MD - Primary  PHYSICIAN ASSISTANT: Montez MoritaKeith Paul, PA-C  ANESTHESIA:   general plus block  I/O:  Total I/O In: 1000 [I.V.:1000] Out: -   SPECIMEN:  No Specimen  DISPOSITION OF SPECIMEN:  To micro  TOURNIQUET:   Total Tourniquet Time Documented: Upper Arm (Right) - 60 minutes Total: Upper Arm (Right) - 60 minutes   DICTATION: .Dictated.

## 2013-03-15 NOTE — Discharge Instructions (Signed)
[facility] Discharge Instructions   General Discharge Instructions  WEIGHT BEARING STATUS: No lifting or pushing with R arm   RANGE OF MOTION/ACTIVITY: gentle elbow flexion and extension, sling for comfort  Wound Care: dressing changes starting on 03/18/2013. Dry dressing and ace wrap.  Ok to shower and wash with soap and water if wound completely dry   Diet: as you were eating previously.  Can use over the counter stool softeners and bowel preparations, such as Miralax, to help with bowel movements.  Narcotics can be constipating.  Be sure to drink plenty of fluids  STOP SMOKING OR USING NICOTINE PRODUCTS!!!!  As discussed nicotine severely impairs your body's ability to heal surgical and traumatic wounds but also impairs bone healing.  Wounds and bone heal by forming microscopic blood vessels (angiogenesis) and nicotine is a vasoconstrictor (essentially, shrinks blood vessels).  Therefore, if vasoconstriction occurs to these microscopic blood vessels they essentially disappear and are unable to deliver necessary nutrients to the healing tissue.  This is one modifiable factor that you can do to dramatically increase your chances of healing your injury.    (This means no smoking, no nicotine gum, patches, etc)  DO NOT USE NONSTEROIDAL ANTI-INFLAMMATORY DRUGS (NSAID'S)  Using products such as Advil (ibuprofen), Aleve (naproxen), Motrin (ibuprofen) for additional pain control during fracture healing can delay and/or prevent the healing response.  If you would like to take over the counter (OTC) medication, Tylenol (acetaminophen) is ok.  However, some narcotic medications that are given for pain control contain acetaminophen as well. Therefore, you should not exceed more than 4000 mg of tylenol in a day if you do not have liver disease.  Also note that there are may OTC medicines, such as cold medicines and allergy medicines that my contain tylenol as well.  If you have any questions  about medications and/or interactions please ask your doctor/PA or your pharmacist.   PAIN MEDICATION USE AND EXPECTATIONS  You have likely been given narcotic medications to help control your pain.  After a traumatic event that results in an fracture (broken bone) with or without surgery, it is ok to use narcotic pain medications to help control one's pain.  We understand that everyone responds to pain differently and each individual patient will be evaluated on a regular basis for the continued need for narcotic medications. Ideally, narcotic medication use should last no more than 6-8 weeks (coinciding with fracture healing).   As a patient it is your responsibility as well to monitor narcotic medication use and report the amount and frequency you use these medications when you come to your office visit.   We would also advise that if you are using narcotic medications, you should take a dose prior to therapy to maximize you participation.  IF YOU ARE ON NARCOTIC MEDICATIONS IT IS NOT PERMISSIBLE TO OPERATE A MOTOR VEHICLE (MOTORCYCLE/CAR/TRUCK/MOPED) OR HEAVY MACHINERY DO NOT MIX NARCOTICS WITH OTHER CNS (CENTRAL NERVOUS SYSTEM) DEPRESSANTS SUCH AS ALCOHOL       ICE AND ELEVATE INJURED/OPERATIVE EXTREMITY  Using ice and elevating the injured extremity above your heart can help with swelling and pain control.  Icing in a pulsatile fashion, such as 20 minutes on and 20 minutes off, can be followed.    Do not place ice directly on skin. Make sure there is a barrier between to skin and the ice pack.    Using frozen items such as frozen peas works well as the conform nicely to the are  that needs to be iced.    IF YOU ARE IN A SPLINT OR CAST DO NOT REMOVE IT FOR ANY REASON   If your splint gets wet for any reason please contact the office immediately. You may shower in your splint or cast as long as you keep it dry.  This can be done by wrapping in a cast cover or garbage back (or similar)  Do  Not stick any thing down your splint or cast such as pencils, money, or hangers to try and scratch yourself with.  If you feel itchy take benadryl as prescribed on the bottle for itching    CALL THE OFFICE WITH ANY QUESTIONS OR CONCERNS: [phone]      .  Discharge Wound Care Instructions  Do NOT apply any ointments, solutions or lotions to pin sites or surgical wounds.  These prevent needed drainage and even though solutions like hydrogen peroxide kill bacteria, they also damage cells lining the pin sites that help fight infection.  Applying lotions or ointments can keep the wounds moist and can cause them to breakdown and open up as well. This can increase the risk for infection. When in doubt call the office.  Surgical incisions should be dressed daily.  If any drainage is noted, use one layer of adaptic, then gauze, Kerlix, and an ace wrap.  Once the incision is completely dry and without drainage, it may be left open to air out.  Showering may begin 36-48 hours later.  Cleaning gently with soap and water.  Traumatic wounds should be dressed daily as well.    One layer of adaptic, gauze, Kerlix, then ace wrap.  The adaptic can be discontinued once the draining has ceased    If you have a wet to dry dressing: wet the gauze with saline the squeeze as much saline out so the gauze is moist (not soaking wet), place moistened gauze over wound, then place a dry gauze over the moist one, followed by Kerlix wrap, then ace wrap.  What to eat:  For your first meals, you should eat lightly; only small meals initially.  If you do not have nausea, you may eat larger meals.  Avoid spicy, greasy and heavy food.    General Anesthesia, Adult, Care After  Refer to this sheet in the next few weeks. These instructions provide you with information on caring for yourself after your procedure. Your health care provider may also give you more specific instructions. Your treatment has been planned  according to current medical practices, but problems sometimes occur. Call your health care provider if you have any problems or questions after your procedure.  WHAT TO EXPECT AFTER THE PROCEDURE  After the procedure, it is typical to experience:  Sleepiness.  Nausea and vomiting. HOME CARE INSTRUCTIONS  For the first 24 hours after general anesthesia:  Have a responsible person with you.  Do not drive a car. If you are alone, do not take public transportation.  Do not drink alcohol.  Do not take medicine that has not been prescribed by your health care provider.  Do not sign important papers or make important decisions.  You may resume a normal diet and activities as directed by your health care provider.  Change bandages (dressings) as directed.  If you have questions or problems that seem related to general anesthesia, call the hospital and ask for the anesthetist or anesthesiologist on call. SEEK MEDICAL CARE IF:  You have nausea and vomiting that continue the day  after anesthesia.  You develop a rash. SEEK IMMEDIATE MEDICAL CARE IF:  You have difficulty breathing.  You have chest pain.  You have any allergic problems. Document Released: 05/26/2000 Document Revised: 10/20/2012 Document Reviewed: 09/02/2012  [facility] Patient Information 2014 [place], [place].

## 2013-03-16 NOTE — Op Note (Signed)
NAME:  Clinton Evans, Andry                 ACCOUNT NO.:  [id number]  MEDICAL RECORD NO.:  [id number]  LOCATION:  [facility]                         FACILITY:  [facility]  PHYSICIAN:  Doralee AlbinoMichael H. Carola FrostHandy, M.D. DATE OF BIRTH:  07/10/1985  DATE OF PROCEDURE:  03/15/2013 DATE OF DISCHARGE:  03/15/2013                              OPERATIVE REPORT   PREOPERATIVE DIAGNOSIS:  Right radial head and neck fractures.  POSTOPERATIVE DIAGNOSES: 1. Right radial head and neck fractures. 2. 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  PROCEDURE:  Radial head arthroplasty using a Biomet 9 mm stem and 24 mm head.  SURGEONS:  Doralee AlbinoMichael H. Carola FrostHandy, MD  ASSISTANT:  Mearl LatinKeith W. Paul, PA-C, assistant 2, PA student.  ANESTHESIA:  General supplemented with supraclavicular block.  COMPLICATIONS:  None.  TOURNIQUET TIME:  Sixty minutes.  DISPOSITION:  To PACU.  CONDITION:  Stable.  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  A CT scan demonstrated significant comminution and involvement with angulation of the radial neck.  I discussed with him the options for repair versus replacement and he did wish to proceed with surgical treatment.  He understood the risks to include infection, nerve injury, vessel injury, DVT, PE, loss of motion, arthritis, need for further surgery, implant loosening and many others.  BRIEF DESCRIPTION OF PROCEDURE:  [name] was taken to the operating room where general anesthesia was induced.  His right upper extremity was prepped and draped in usual sterile fashion, and sterile tourniquet was used over the upper arm.  The standard Kocher interval was proceeded down to the radial capitellar joint, which was incised anterior  and superior to the lateral collateral ligament.  Hematoma was evacuated and immediately the severe cartilage injury to the capitellum was visible. This extended over full 50% of the surface from the most superior and anterior margin all the way to the posterior and inferior one or distal one.  The articular cartilage had actually folded like a cart accordion within the primary fracture plane of the radial head and was not free floating within the joint.  The cartilage was disimpacted from the head. The head fragments were not anatomically reducible and there was no way to put the plate in an area that could control the fragment adequately because of their location on the ulnar side.  Next, the bone fragments were removed and it was sized to well over a 24, but we did select the largest available from the Biomet.  Similarly, we reamed sequentially achieving an excellent fit and fill with the 9 mm reamer.  We did not get any apposition of implant to bone or stem to bone until that 9 mm size.  This was trialed and checked under fluoro.  The cut was slightly refined and then the actual implants placed.  The elbow was completely stable through a full range of motion with flexion, extension, supination, and pronation.  Wound was closed in standard fashion  after obtaining orthogonal x-rays using #1 Vicryl, 0 Vicryl, 2-0 Vicryl, and 3- 0 nylon.  Sterile gently compressive dressing was applied with the elbow at 90 degrees.  There was a bulky dressing and no formal splint was applied, just a sling because of the stability of the elbow joint following repair.  Montez Morita, PA-C did assist me throughout.  Again tourniquet time was approximately 60 minutes.  PROGNOSIS:  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  I am concerned given his job and the vibratory nature of chainsaw use that this could yield loosening as well.  I have  discussed these features with him, but we will see how he progresses.  Certainly, the stability is helpful.     Doralee Albino. Carola Frost, M.D.     [facility]  D:  03/15/2013  T:  03/16/2013  Job:  [id number]

## 2013-03-18 ENCOUNTER — Encounter (HOSPITAL_COMMUNITY): Payer: Self-pay | Admitting: Orthopedic Surgery

## 2013-11-02 ENCOUNTER — Telehealth: Payer: Self-pay | Admitting: General Practice

## 2013-11-03 NOTE — Telephone Encounter (Signed)
appt given for tomorrow with Clinton Evans 

## 2013-11-04 ENCOUNTER — Encounter (INDEPENDENT_AMBULATORY_CARE_PROVIDER_SITE_OTHER): Payer: Self-pay

## 2013-11-04 ENCOUNTER — Encounter: Payer: Self-pay | Admitting: Nurse Practitioner

## 2013-11-04 ENCOUNTER — Ambulatory Visit: Payer: Self-pay | Admitting: Nurse Practitioner

## 2013-11-04 VITALS — BP 137/89 | HR 97 | Temp 97.6°F | Ht 73.0 in | Wt 148.0 lb

## 2013-11-04 DIAGNOSIS — F411 Generalized anxiety disorder: Secondary | ICD-10-CM

## 2013-11-04 DIAGNOSIS — F909 Attention-deficit hyperactivity disorder, unspecified type: Secondary | ICD-10-CM

## 2013-11-04 MED ORDER — CLONAZEPAM 0.5 MG PO TABS: 0.5000 mg | ORAL_TABLET | Freq: Every day | ORAL | Status: DC | PRN

## 2013-11-04 MED ORDER — METHYLPHENIDATE HCL ER (OSM) 54 MG PO TBCR: 54.0000 mg | EXTENDED_RELEASE_TABLET | ORAL | Status: DC

## 2013-11-04 MED ORDER — FLUOXETINE HCL 40 MG PO CAPS: 40.0000 mg | ORAL_CAPSULE | Freq: Every day | ORAL | Status: DC

## 2013-11-04 NOTE — Progress Notes (Signed)
   Subjective:    Patient ID: Eustaquio Boyden, male    DOB: 1985/07/15, 28 y.o.   MRN: [id number]  HPI Patient in today c/o anxiety- says that he is having trouble sleeping at night and stays frustrated. Was on proZac that worked the best for him in the past- He also took klonopin prn which helped. He also had ADHD in the past and was on ritalin in elementary school and then adderall in high school.Says that he has trouble concentrating at work.    Review of Systems  Constitutional: Negative.   HENT: Negative.   Respiratory: Negative.   Cardiovascular: Negative.   Genitourinary: Negative.   Neurological: Negative.   Psychiatric/Behavioral: Positive for behavioral problems, sleep disturbance and decreased concentration. The patient is nervous/anxious.   All other systems reviewed and are negative.      Objective:   Physical Exam  Constitutional: He is oriented to person, place, and time. He appears well-developed and well-nourished.  Cardiovascular: Normal rate, regular rhythm and normal heart sounds.   Pulmonary/Chest: Effort normal and breath sounds normal.  Neurological: He is alert and oriented to person, place, and time.  Skin: Skin is warm.  Psychiatric: He has a normal mood and affect. His behavior is normal. Judgment and thought content normal.   BP 137/89  Pulse 97  Temp(Src) 97.6 F (36.4 C) (Oral)  Ht  (1.854 m)  Wt 148 lb (67.132 kg)  BMI 19.53 kg/m2        Assessment & Plan:  1. Generalized anxiety disorder Stress management - FLUoxetine (PROZAC) 40 MG capsule; Take 1 capsule (40 mg total) by mouth daily.  Dispense: 30 capsule; Refill: 5 - clonazePAM (KLONOPIN) 0.5 MG tablet; Take 1 tablet (0.5 mg total) by mouth daily as needed for anxiety.  Dispense: 30 tablet; Refill: 0  2. Adult ADHD Side effects reviewed - methylphenidate 54 MG PO CR tablet; Take 1 tablet (54 mg total) by mouth every morning.  Dispense: 30 tablet; Refill: 0   Mary-Margaret Daphine Deutscher,  FNP

## 2013-11-04 NOTE — Patient Instructions (Signed)
Stress and Stress Management Stress is a normal reaction to life events. It is what you feel when life demands more than you are used to or more than you can handle. Some stress can be useful. For example, the stress reaction can help you catch the last bus of the day, study for a test, or meet a deadline at work. But stress that occurs too often or for too long can cause problems. It can affect your emotional health and interfere with relationships and normal daily activities. Too much stress can weaken your immune system and increase your risk for physical illness. If you already have a medical problem, stress can make it worse. CAUSES  All sorts of life events may cause stress. An event that causes stress for one person may not be stressful for another person. Major life events commonly cause stress. These may be positive or negative. Examples include losing your job, moving into a new home, getting married, having a baby, or losing a loved one. Less obvious life events may also cause stress, especially if they occur day after day or in combination. Examples include working long hours, driving in traffic, caring for children, being in debt, or being in a difficult relationship. SIGNS AND SYMPTOMS Stress may cause emotional symptoms including, the following:  Anxiety. This is feeling worried, afraid, on edge, overwhelmed, or out of control.  Anger. This is feeling irritated or impatient.  Depression. This is feeling sad, down, helpless, or guilty.  Difficulty focusing, remembering, or making decisions. Stress may cause physical symptoms, including the following:   Aches and pains. These may affect your head, neck, back, stomach, or other areas of your body.  Tight muscles or clenched jaw.  Low energy or trouble sleeping. Stress may cause unhealthy behaviors, including the following:   Eating to feel better (overeating) or skipping meals.  Sleeping too little, too much, or both.  Working  too much or putting off tasks (procrastination).  Smoking, drinking alcohol, or using drugs to feel better. DIAGNOSIS  Stress is diagnosed through an assessment by your health care provider. Your health care provider will ask questions about your symptoms and any stressful life events.Your health care provider will also ask about your medical history and may order blood tests or other tests. Certain medical conditions and medicine can cause physical symptoms similar to stress. Mental illness can cause emotional symptoms and unhealthy behaviors similar to stress. Your health care provider may refer you to a mental health professional for further evaluation.  TREATMENT  Stress management is the recommended treatment for stress.The goals of stress management are reducing stressful life events and coping with stress in healthy ways.  Techniques for reducing stressful life events include the following:  Stress identification. Self-monitor for stress and identify what causes stress for you. These skills may help you to avoid some stressful events.  Time management. Set your priorities, keep a calendar of events, and learn to say "no." These tools can help you avoid making too many commitments. Techniques for coping with stress include the following:  Rethinking the problem. Try to think realistically about stressful events rather than ignoring them or overreacting. Try to find the positives in a stressful situation rather than focusing on the negatives.  Exercise. Physical exercise can release both physical and emotional tension. The key is to find a form of exercise you enjoy and do it regularly.  Relaxation techniques. These relax the body and mind. Examples include yoga, meditation, tai chi, biofeedback, deep  breathing, progressive muscle relaxation, listening to music, being out in nature, journaling, and other hobbies. Again, the key is to find one or more that you enjoy and can do  regularly.  Healthy lifestyle. Eat a balanced diet, get plenty of sleep, and do not smoke. Avoid using alcohol or drugs to relax.  Strong support network. Spend time with family, friends, or other people you enjoy being around.Express your feelings and talk things over with someone you trust. Counseling or talktherapy with a mental health professional may be helpful if you are having difficulty managing stress on your own. Medicine is typically not recommended for the treatment of stress.Talk to your health care provider if you think you need medicine for symptoms of stress. HOME CARE INSTRUCTIONS  Keep all follow-up visits as directed by your health care provider.  Take all medicines as directed by your health care provider. SEEK MEDICAL CARE IF:  Your symptoms get worse or you start having new symptoms.  You feel overwhelmed by your problems and can no longer manage them on your own. SEEK IMMEDIATE MEDICAL CARE IF:  You feel like hurting yourself or someone else. Document Released: 08/13/2000 Document Revised: 07/04/2013 Document Reviewed: 10/12/2012 Exit[employer] Patient Information 2015 [employer]. This information is not intended to replace advice given to you by your health care provider. Make sure you discuss any questions you have with your health care provider.  

## 2013-11-08 ENCOUNTER — Telehealth: Payer: Self-pay | Admitting: Nurse Practitioner

## 2013-11-08 ENCOUNTER — Other Ambulatory Visit: Payer: Self-pay | Admitting: Nurse Practitioner

## 2013-11-08 MED ORDER — AMPHETAMINE-DEXTROAMPHETAMINE 20 MG PO TABS: 20.0000 mg | ORAL_TABLET | Freq: Every day | ORAL | Status: DC

## 2013-11-08 NOTE — Telephone Encounter (Signed)
Already addressed and spoke with patient

## 2013-11-08 NOTE — Telephone Encounter (Signed)
Patient aware rx up front to be picked up 

## 2013-11-10 ENCOUNTER — Ambulatory Visit: Payer: Self-pay | Admitting: Nurse Practitioner

## 2013-12-05 ENCOUNTER — Telehealth: Payer: Self-pay | Admitting: Family Medicine

## 2013-12-05 MED ORDER — AMPHETAMINE-DEXTROAMPHETAMINE 20 MG PO TABS: 20.0000 mg | ORAL_TABLET | Freq: Every day | ORAL | Status: DC

## 2013-12-05 NOTE — Telephone Encounter (Signed)
rx ready for pickup 

## 2013-12-06 ENCOUNTER — Telehealth: Payer: Self-pay | Admitting: Nurse Practitioner

## 2013-12-06 MED ORDER — AMPHETAMINE-DEXTROAMPHETAMINE 20 MG PO TABS: ORAL_TABLET | ORAL | Status: DC

## 2013-12-06 NOTE — Telephone Encounter (Signed)
He is suppose to be on 2 every  Morning of his adderall it was started last month the  Rx was written for 1 # 6 [facility] needs us to correct

## 2013-12-06 NOTE — Telephone Encounter (Signed)
Corrected rx ready for pick up

## 2013-12-06 NOTE — Telephone Encounter (Signed)
Patient aware.

## 2014-01-06 ENCOUNTER — Telehealth: Payer: Self-pay | Admitting: Nurse Practitioner

## 2014-01-06 MED ORDER — AMPHETAMINE-DEXTROAMPHETAMINE 20 MG PO TABS: ORAL_TABLET | ORAL | Status: DC

## 2014-01-06 NOTE — Telephone Encounter (Signed)
adderal rx ready for pick up

## 2014-02-06 ENCOUNTER — Other Ambulatory Visit: Payer: Self-pay | Admitting: Nurse Practitioner

## 2014-02-06 ENCOUNTER — Telehealth: Payer: Self-pay | Admitting: *Deleted

## 2014-02-06 MED ORDER — AMPHETAMINE-DEXTROAMPHETAMINE 20 MG PO TABS: ORAL_TABLET | ORAL | Status: DC

## 2014-02-06 NOTE — Telephone Encounter (Signed)
This is okay to refill until his appointment in January.

## 2014-02-06 NOTE — Telephone Encounter (Signed)
Last filled 01/06/14, last seen 90/04/15. Must print.

## 2014-02-06 NOTE — Telephone Encounter (Signed)
Script ready.

## 2014-02-06 NOTE — Telephone Encounter (Signed)
Please review and advise.

## 2014-03-06 ENCOUNTER — Encounter: Payer: Self-pay | Admitting: Nurse Practitioner

## 2014-03-06 ENCOUNTER — Ambulatory Visit (INDEPENDENT_AMBULATORY_CARE_PROVIDER_SITE_OTHER): Payer: 59 | Admitting: Nurse Practitioner

## 2014-03-06 VITALS — BP 145/85 | HR 82 | Temp 96.5°F | Ht 73.0 in | Wt 156.0 lb

## 2014-03-06 DIAGNOSIS — M545 Low back pain, unspecified: Secondary | ICD-10-CM

## 2014-03-06 DIAGNOSIS — F900 Attention-deficit hyperactivity disorder, predominantly inattentive type: Secondary | ICD-10-CM

## 2014-03-06 DIAGNOSIS — F909 Attention-deficit hyperactivity disorder, unspecified type: Secondary | ICD-10-CM | POA: Insufficient documentation

## 2014-03-06 MED ORDER — PREDNISONE (PAK) 10 MG PO TABS: ORAL_TABLET | Freq: Every day | ORAL | Status: DC

## 2014-03-06 MED ORDER — CYCLOBENZAPRINE HCL 5 MG PO TABS: 5.0000 mg | ORAL_TABLET | Freq: Three times a day (TID) | ORAL | Status: DC | PRN

## 2014-03-06 MED ORDER — NAPROXEN 500 MG PO TABS: 500.0000 mg | ORAL_TABLET | Freq: Two times a day (BID) | ORAL | Status: DC

## 2014-03-06 MED ORDER — AMPHETAMINE-DEXTROAMPHETAMINE 20 MG PO TABS: ORAL_TABLET | ORAL | Status: DC

## 2014-03-06 MED ORDER — AMPHETAMINE-DEXTROAMPHETAMINE 20 MG PO TABS: 20.0000 mg | ORAL_TABLET | Freq: Two times a day (BID) | ORAL | Status: DC

## 2014-03-06 NOTE — Patient Instructions (Signed)

## 2014-03-06 NOTE — Progress Notes (Signed)
   Subjective:    Patient ID: Clinton Evans, male    DOB: 1985-05-25, 29 y.o.   MRN: [id number]  HPI Patiuent here today for follow up of ADHD- he is currently on adderall 20 mg daily- has helped a lot with his focus at work. He is still on prozac which has helped his mood swings. He denies and side effects.  * New complaint today of back pain- started about 5 weeks ago- worsening- pain is constant but changes in intensity- patient remains in lower back. Rates pain 5-10/10. Has tried back max OTC but no relief- has tried topical things which have nit helped.    Review of Systems  Constitutional: Negative.   HENT: Negative.   Respiratory: Negative.   Cardiovascular: Negative.   Gastrointestinal: Negative.   Genitourinary: Negative.   Neurological: Negative.   Psychiatric/Behavioral: Negative.   All other systems reviewed and are negative.      Objective:   Physical Exam  Constitutional: He is oriented to person, place, and time. He appears well-developed and well-nourished.  Cardiovascular: Normal rate, regular rhythm and normal heart sounds.   Pulmonary/Chest: Effort normal and breath sounds normal.  Abdominal: Soft. Bowel sounds are normal.  Musculoskeletal:  FROM without increase in pain (-) SLR bil DTR's equal bil Motor strength and sensation distally intact.  Neurological: He is alert and oriented to person, place, and time.  Skin: Skin is warm and dry.  Psychiatric: He has a normal mood and affect. His behavior is normal. Judgment and thought content normal.    BP 145/85 mmHg  Pulse 82  Temp(Src) 96.5 F (35.8 C) (Oral)  Ht  (1.854 m)  Wt 156 lb (70.761 kg)  BMI 20.59 kg/m2       Assessment & Plan:  1. Midline low back pain without sciatica Flexeril will cause sedation Back strengthening exercises Will order MRI if not improving in 1 week - predniSONE (STERAPRED UNI-PAK) 10 MG tablet; Take by mouth daily. As directed X 12 days  Dispense: 42 tablet;  Refill: 0 - naproxen (NAPROSYN) 500 MG tablet; Take 1 tablet (500 mg total) by mouth 2 (two) times daily with a meal.  Dispense: 30 tablet; Refill: 0 hold while taking steroids - cyclobenzaprine (FLEXERIL) 5 MG tablet; Take 1 tablet (5 mg total) by mouth 3 (three) times daily as needed for muscle spasms.  Dispense: 30 tablet; Refill: 0  2. Adult ADHD Stress management - amphetamine-dextroamphetamine (ADDERALL) 20 MG tablet; 2 po qd  Dispense: 60 tablet; Refill: 0 - amphetamine-dextroamphetamine (ADDERALL) 20 MG tablet; Take 1 tablet (20 mg total) by mouth 2 (two) times daily.  Dispense: 60 tablet; Refill: 0  Mary-Margaret Daphine Deutscher, FNP

## 2014-04-04 ENCOUNTER — Telehealth: Payer: Self-pay | Admitting: Nurse Practitioner

## 2014-04-04 DIAGNOSIS — F909 Attention-deficit hyperactivity disorder, unspecified type: Secondary | ICD-10-CM

## 2014-04-04 MED ORDER — AMPHETAMINE-DEXTROAMPHETAMINE 20 MG PO TABS: ORAL_TABLET | ORAL | Status: DC

## 2014-04-04 NOTE — Telephone Encounter (Signed)
adderall rx ready for pick up  

## 2014-04-05 NOTE — Telephone Encounter (Signed)
Patient notified that rx will be up front and ready for pick up after 2pm today

## 2014-04-23 IMAGING — CT CT ELBOW*R* W/O CM
3 of 4 series · 13 of 33 positions shown, 16 images · non-contrast
Comparison: DG ELBOW COMPLETE*R* dated 03/08/2013

CLINICAL DATA: Fall from tree yesterday.  Radial head fracture.

EXAM:
CT OF THE RIGHT ELBOW WITHOUT CONTRAST
TECHNIQUE: Multidetector CT imaging was performed according to the standard
protocol. Multiplanar CT image reconstructions were also generated.

[Series 3: lfov ext 3.0 b40s · axial · 0.44mm/px · z∈[+718,+817]mm · 7 of 41 slices shown, 9 images]
[im 4/41  soft-tissue]
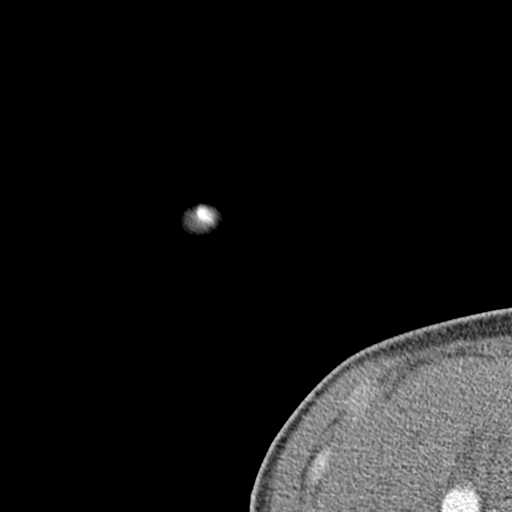
[im 4/41  bone]
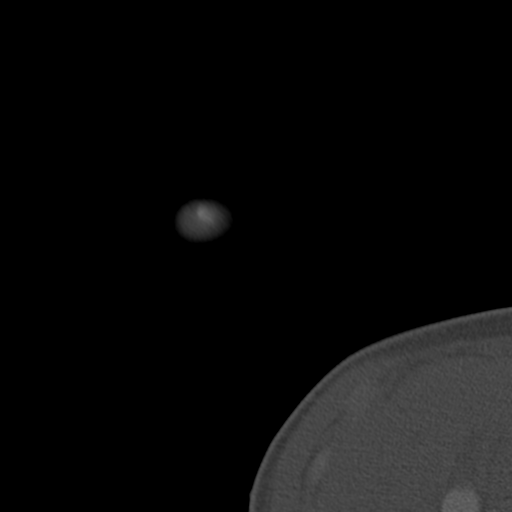
[im 10/41  bone]
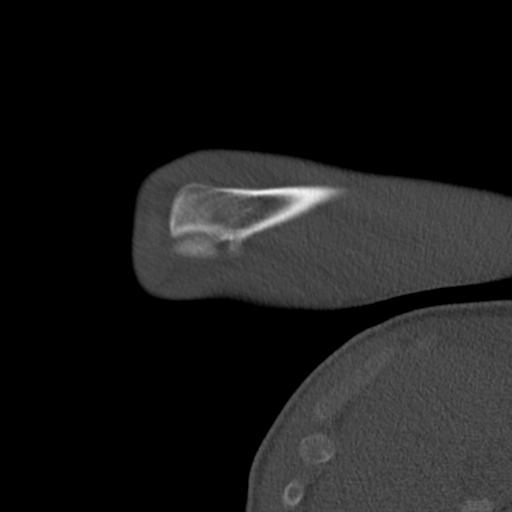
[im 16/41  bone]
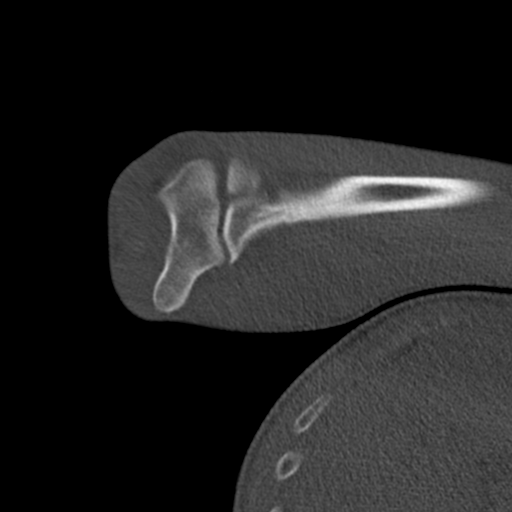
[im 22/41  bone]
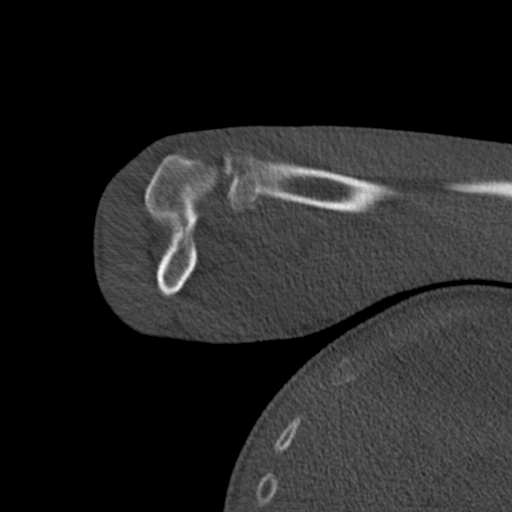
[im 25/41  soft-tissue]
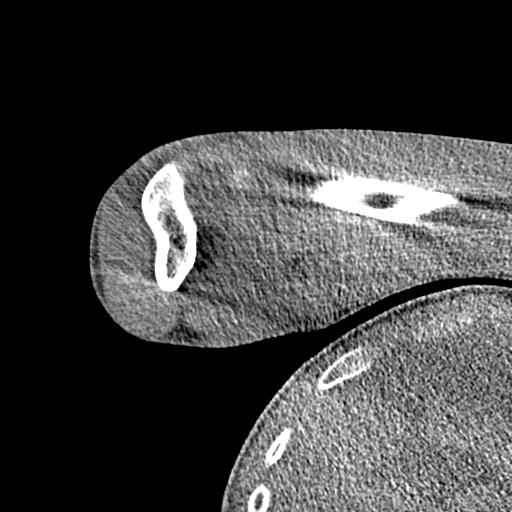
[im 25/41  bone]
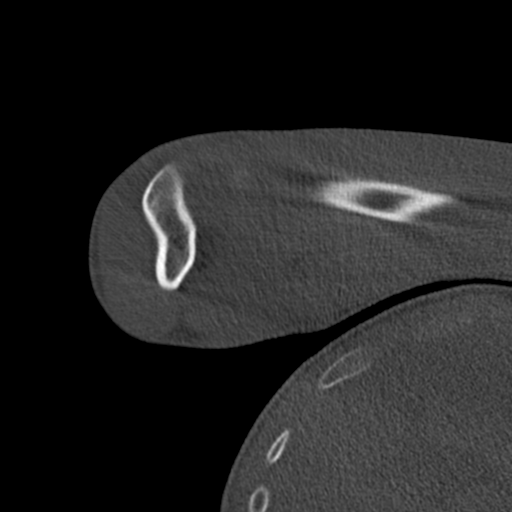
[im 31/41  bone]
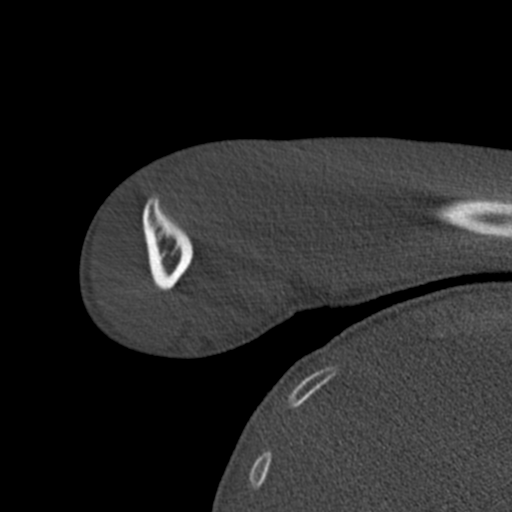
[im 37/41  bone]
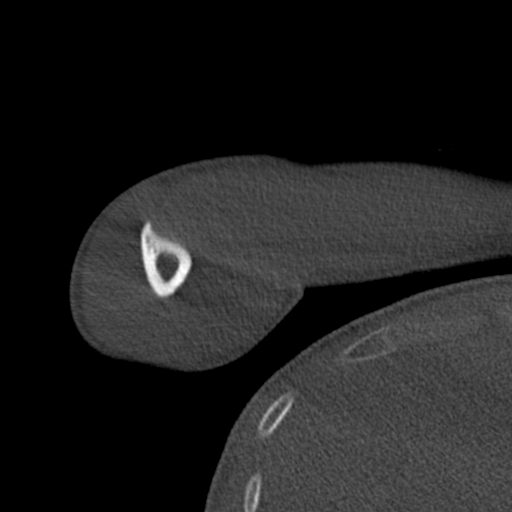

[Series 6: coronal bone · coronal · 0.37mm/px · 1 of 101 slices shown]
[im 51/101  bone]
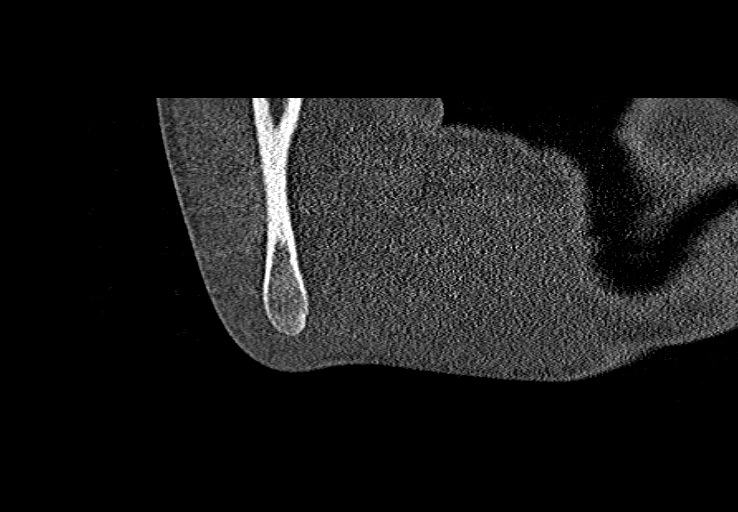

[Series 8: sagittalsoft tissue · sagittal · 0.34mm/px · 5 of 112 slices shown, 6 images]
[im 38/112  bone]
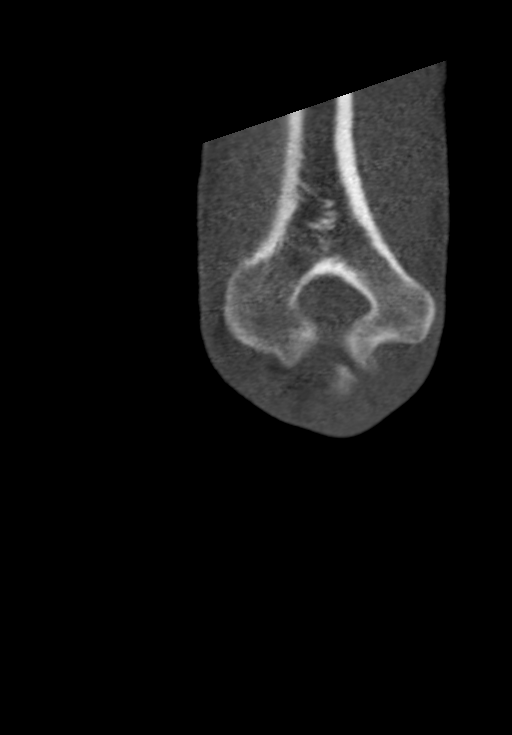
[im 47/112  bone]
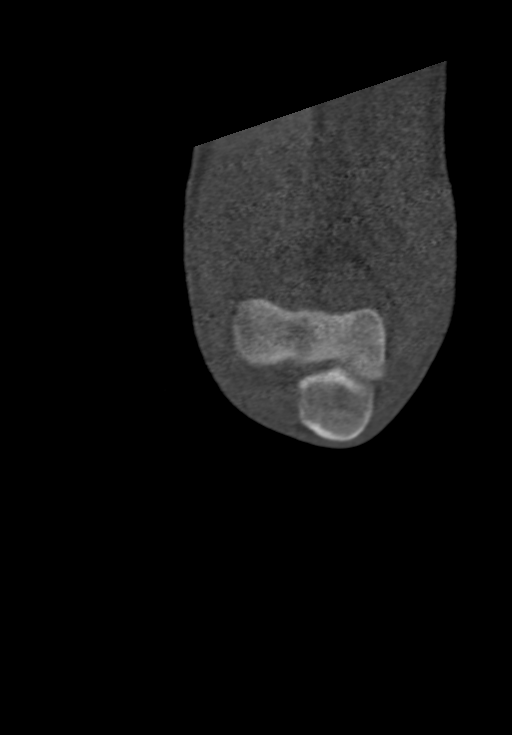
[im 56/112  soft-tissue]
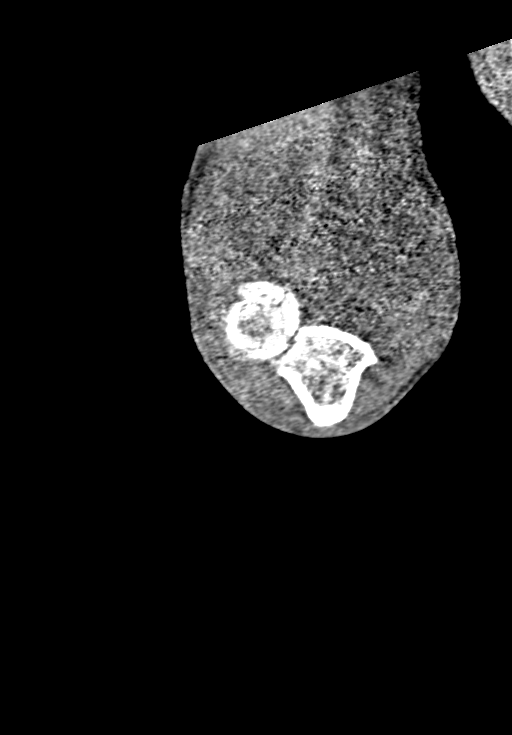
[im 56/112  bone]
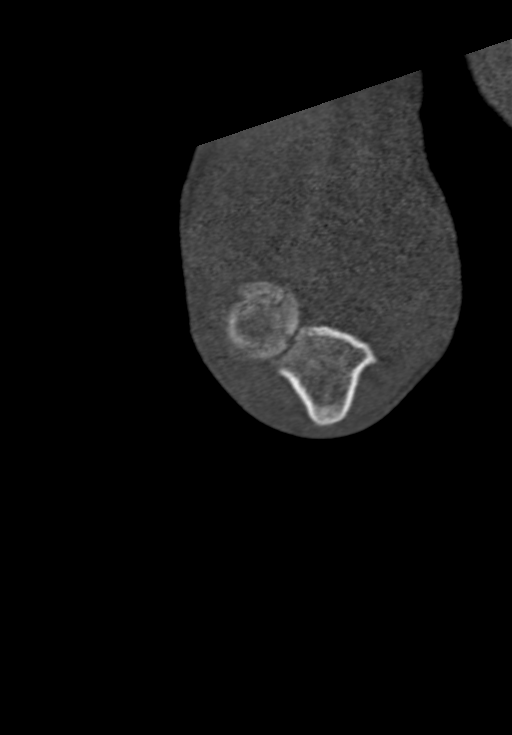
[im 65/112  bone]
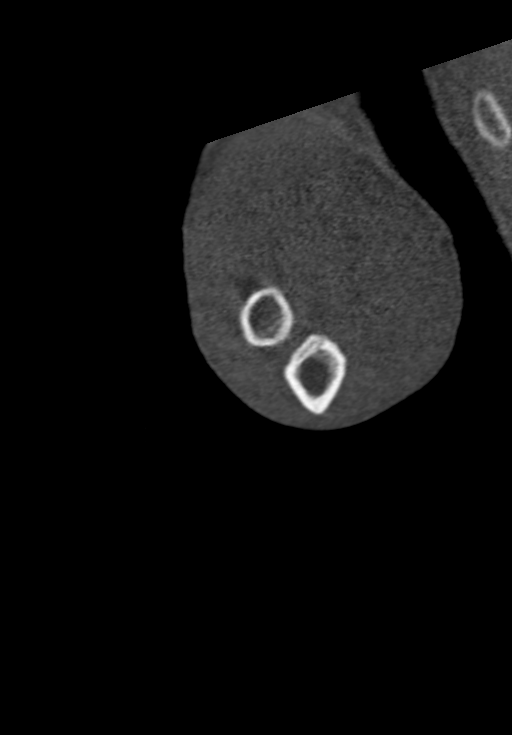
[im 75/112  bone]
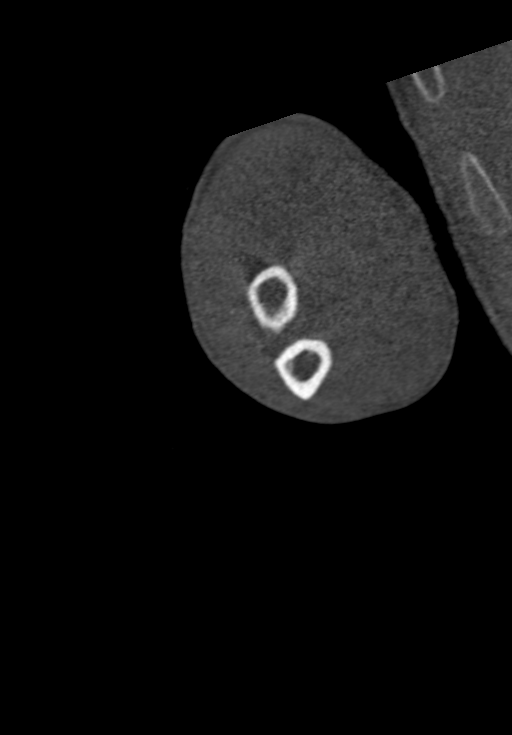

[13 of 33 positions shown; findings below may reference images not displayed]

FINDINGS: Examination was performed with the forearm positioned over the
patient's abdomen. This results in beam hardening artifact and loss
of intra-articular detail.

There is a transverse intra-articular fracture through the radial
head with 3 mm of depression of the articular surface anteriorly.
The fracture demonstrates up to 4 mm of displacement at the
articular surface. This fracture extends into the radioulnar joint.
There is no radio capitellar subluxation.

The proximal ulna and distal humerus are intact. There is a
moderate-sized elbow joint effusion. No intra-articular loose bodies
are identified.
IMPRESSION: 1. Mildly displaced and depressed intra-articular fracture of the
radial head.
2. No evidence of capitellar injury, subluxation or loose body.

## 2014-04-29 IMAGING — RF DG ELBOW 2V*R*
1 series · 3 of 3 positions shown · non-contrast
Comparison: None.

CLINICAL DATA: Status post radial head arthroplasty

EXAM:
RIGHT ELBOW - 2 VIEW

[Series 1: run · 3 of 3 slices shown]
[im 1/3]
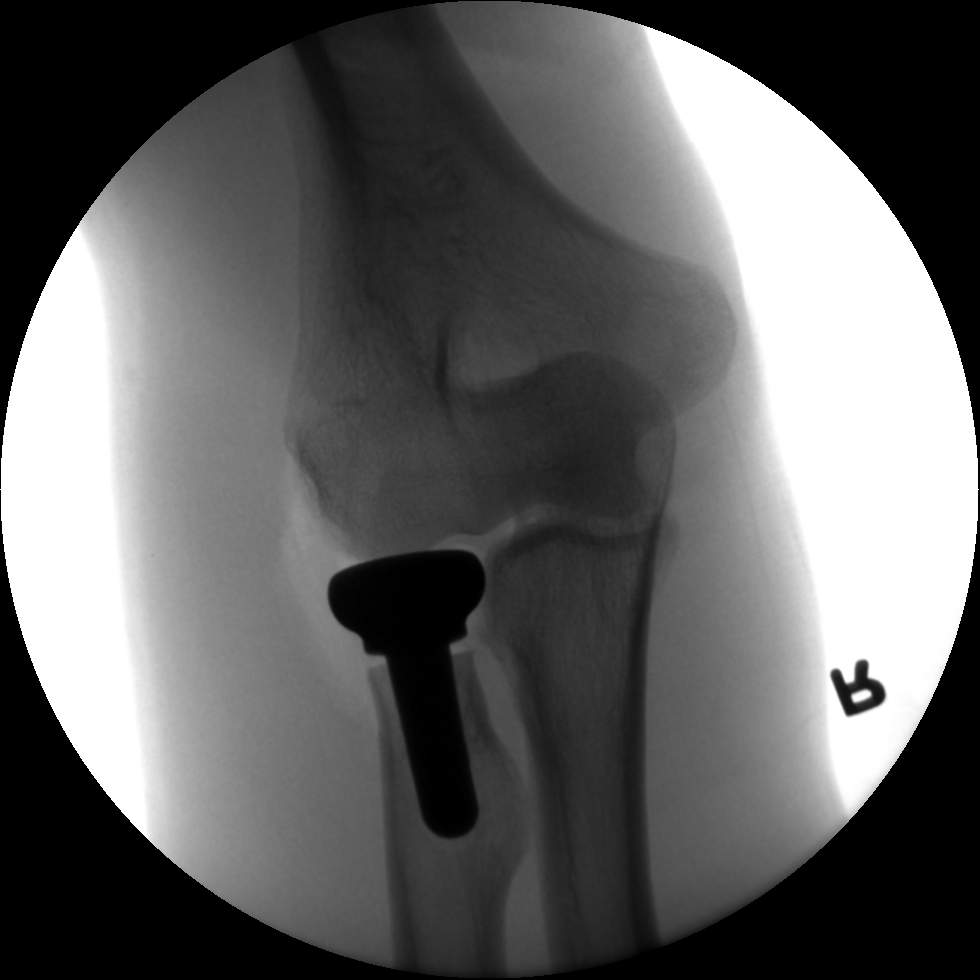
[im 2/3]
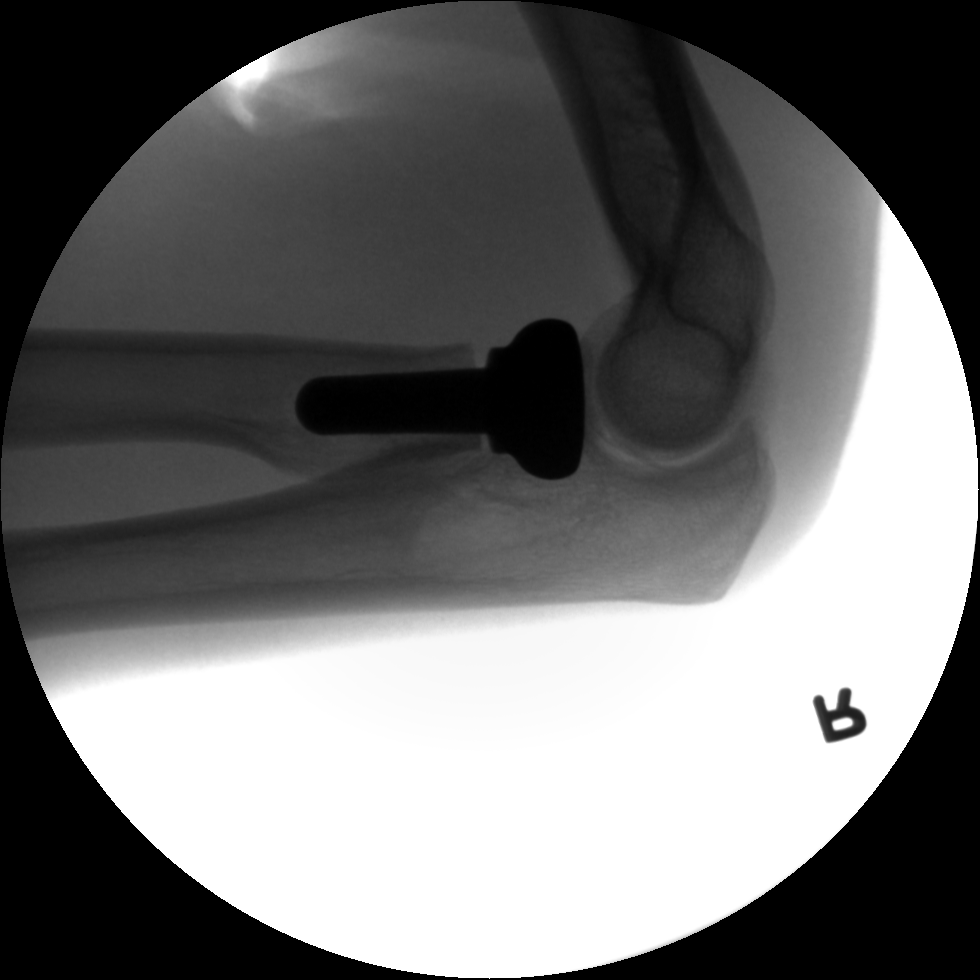
[im 3/3]
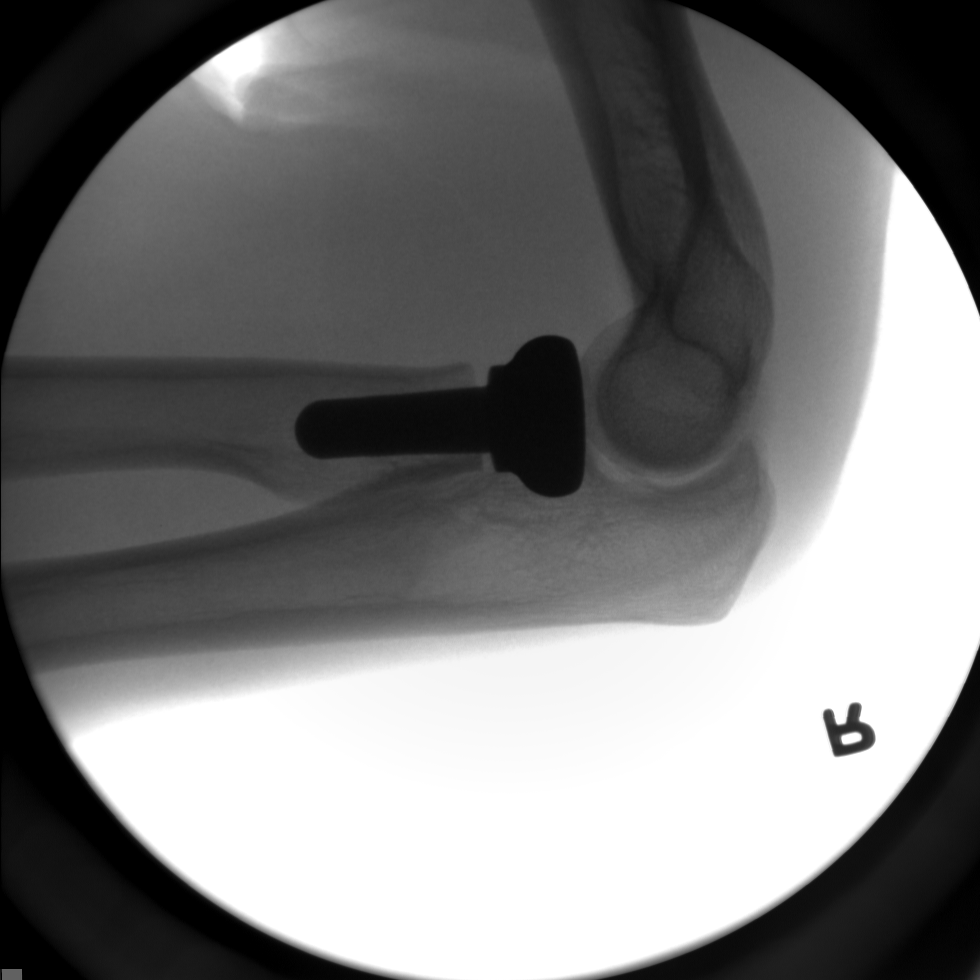

[3 of 3 positions shown; findings below may reference images not displayed]

FINDINGS: Radial head arthroplasty is appreciated. There is no radiographic
evidence of loosening or hardware failure. Native osseous structures
grossly unremarkable.
IMPRESSION: Patient is status post radial head arthroplasty.

## 2014-05-02 ENCOUNTER — Telehealth: Payer: Self-pay | Admitting: Nurse Practitioner

## 2014-05-02 DIAGNOSIS — F909 Attention-deficit hyperactivity disorder, unspecified type: Secondary | ICD-10-CM

## 2014-05-02 MED ORDER — AMPHETAMINE-DEXTROAMPHETAMINE 20 MG PO TABS: 20.0000 mg | ORAL_TABLET | Freq: Two times a day (BID) | ORAL | Status: DC

## 2014-05-02 NOTE — Telephone Encounter (Signed)
aderall rx ready for pick up  

## 2014-05-16 ENCOUNTER — Telehealth: Payer: Self-pay | Admitting: Nurse Practitioner

## 2014-05-16 DIAGNOSIS — M5442 Lumbago with sciatica, left side: Secondary | ICD-10-CM

## 2014-05-16 NOTE — Telephone Encounter (Signed)
MRI ordered

## 2014-05-17 ENCOUNTER — Telehealth: Payer: Self-pay | Admitting: Nurse Practitioner

## 2014-05-17 MED ORDER — TRAMADOL HCL 50 MG PO TABS: 50.0000 mg | ORAL_TABLET | Freq: Four times a day (QID) | ORAL | Status: DC | PRN

## 2014-05-17 NOTE — Telephone Encounter (Signed)
Please call patient or his wife and have them come pick up a prescription once we decide which medicines were given to giving him

## 2014-05-17 NOTE — Telephone Encounter (Signed)
Sent message to [facility] this am but patient is in a lot of pain. Has an appt for an MRI this Tuesday. Wants to know if this can be addressed today. Please advise

## 2014-05-17 NOTE — Telephone Encounter (Signed)
Per Dr Christell ConstantMoore okay for Tramadol 1 tablet qid prn RX printed and given to wife

## 2014-05-17 NOTE — Telephone Encounter (Signed)
Please see phone message and advise. I didn't see any mention of percocet in the note

## 2014-05-18 ENCOUNTER — Telehealth: Payer: Self-pay | Admitting: Nurse Practitioner

## 2014-05-18 NOTE — Telephone Encounter (Signed)
Have to wait until MRI . He can take 2 at a time if needs to

## 2014-05-18 NOTE — Telephone Encounter (Signed)
Pt took 2 tramadol this morning, then 2 more before lunch without any relief. What else can he do and can the MRI get moved up.

## 2014-05-18 NOTE — Telephone Encounter (Signed)
Dr. Christell ConstantMoore gave patient tramadol yesterday for the back pain and he states that it is not touching the pain his MRI is Tuesday. Please advise

## 2014-05-19 MED ORDER — HYDROCODONE-ACETAMINOPHEN 5-325 MG PO TABS: 1.0000 | ORAL_TABLET | Freq: Four times a day (QID) | ORAL | Status: DC | PRN

## 2014-05-19 NOTE — Telephone Encounter (Signed)
Patient wife aware rx ready to be picked up.

## 2014-05-19 NOTE — Telephone Encounter (Signed)
norco rx ready for pick up  

## 2014-05-23 ENCOUNTER — Ambulatory Visit (HOSPITAL_COMMUNITY)
Admission: RE | Admit: 2014-05-23 | Discharge: 2014-05-23 | Disposition: A | Discharge status: Home / Self Care | Payer: 59 | Source: Ambulatory Visit | Attending: Nurse Practitioner | Admitting: Nurse Practitioner

## 2014-05-23 DIAGNOSIS — M5442 Lumbago with sciatica, left side: Secondary | ICD-10-CM

## 2014-05-31 ENCOUNTER — Other Ambulatory Visit: Payer: Self-pay | Admitting: *Deleted

## 2014-05-31 ENCOUNTER — Telehealth: Payer: Self-pay | Admitting: Nurse Practitioner

## 2014-05-31 ENCOUNTER — Ambulatory Visit: Payer: 59 | Admitting: Nurse Practitioner

## 2014-05-31 DIAGNOSIS — F909 Attention-deficit hyperactivity disorder, unspecified type: Secondary | ICD-10-CM

## 2014-05-31 MED ORDER — AMPHETAMINE-DEXTROAMPHETAMINE 20 MG PO TABS: ORAL_TABLET | ORAL | Status: DC

## 2014-05-31 NOTE — Progress Notes (Signed)
adderall rx ready for pick up  

## 2014-06-28 ENCOUNTER — Other Ambulatory Visit: Payer: Self-pay | Admitting: Nurse Practitioner

## 2014-06-28 ENCOUNTER — Ambulatory Visit: Payer: 59 | Admitting: Nurse Practitioner

## 2014-06-28 DIAGNOSIS — F909 Attention-deficit hyperactivity disorder, unspecified type: Secondary | ICD-10-CM

## 2014-06-28 MED ORDER — AMPHETAMINE-DEXTROAMPHETAMINE 20 MG PO TABS
ORAL_TABLET | ORAL | Status: DC
Start: 2014-06-28 — End: 2014-07-27

## 2014-06-30 ENCOUNTER — Ambulatory Visit: Payer: 59 | Admitting: Family

## 2014-07-21 ENCOUNTER — Other Ambulatory Visit: Payer: Self-pay | Admitting: Nurse Practitioner

## 2014-07-21 DIAGNOSIS — F909 Attention-deficit hyperactivity disorder, unspecified type: Secondary | ICD-10-CM

## 2014-07-21 MED ORDER — AMPHETAMINE-DEXTROAMPHETAMINE 20 MG PO TABS: 20.0000 mg | ORAL_TABLET | Freq: Two times a day (BID) | ORAL | Status: DC

## 2014-07-21 NOTE — Telephone Encounter (Signed)
Adderall.pick no more refills without being seen

## 2014-07-24 NOTE — Telephone Encounter (Signed)
Left detailed message stating rx is ready for pick up and that he should schedule his appt when he picks up the rx so he won't have to worry about refills as he can't have any further refills without being seen.

## 2014-07-27 ENCOUNTER — Encounter: Payer: Self-pay | Admitting: Nurse Practitioner

## 2014-07-27 ENCOUNTER — Ambulatory Visit (INDEPENDENT_AMBULATORY_CARE_PROVIDER_SITE_OTHER): Payer: 59 | Admitting: Nurse Practitioner

## 2014-07-27 VITALS — BP 150/98 | HR 91 | Temp 97.0°F | Ht 73.0 in | Wt 161.0 lb

## 2014-07-27 DIAGNOSIS — F900 Attention-deficit hyperactivity disorder, predominantly inattentive type: Secondary | ICD-10-CM

## 2014-07-27 DIAGNOSIS — F909 Attention-deficit hyperactivity disorder, unspecified type: Secondary | ICD-10-CM

## 2014-07-27 MED ORDER — AMPHETAMINE-DEXTROAMPHETAMINE 20 MG PO TABS: ORAL_TABLET | ORAL | Status: DC

## 2014-07-27 MED ORDER — AMPHETAMINE-DEXTROAMPHETAMINE 20 MG PO TABS: 20.0000 mg | ORAL_TABLET | Freq: Two times a day (BID) | ORAL | Status: DC

## 2014-07-27 NOTE — Patient Instructions (Signed)
Stress and Stress Management Stress is a normal reaction to life events. It is what you feel when life demands more than you are used to or more than you can handle. Some stress can be useful. For example, the stress reaction can help you catch the last bus of the day, study for a test, or meet a deadline at work. But stress that occurs too often or for too long can cause problems. It can affect your emotional health and interfere with relationships and normal daily activities. Too much stress can weaken your immune system and increase your risk for physical illness. If you already have a medical problem, stress can make it worse. CAUSES  All sorts of life events may cause stress. An event that causes stress for one person may not be stressful for another person. Major life events commonly cause stress. These may be positive or negative. Examples include losing your job, moving into a new home, getting married, having a baby, or losing a loved one. Less obvious life events may also cause stress, especially if they occur day after day or in combination. Examples include working long hours, driving in traffic, caring for children, being in debt, or being in a difficult relationship. SIGNS AND SYMPTOMS Stress may cause emotional symptoms including, the following:  Anxiety. This is feeling worried, afraid, on edge, overwhelmed, or out of control.  Anger. This is feeling irritated or impatient.  Depression. This is feeling sad, down, helpless, or guilty.  Difficulty focusing, remembering, or making decisions. Stress may cause physical symptoms, including the following:   Aches and pains. These may affect your head, neck, back, stomach, or other areas of your body.  Tight muscles or clenched jaw.  Low energy or trouble sleeping. Stress may cause unhealthy behaviors, including the following:   Eating to feel better (overeating) or skipping meals.  Sleeping too little, too much, or both.  Working  too much or putting off tasks (procrastination).  Smoking, drinking alcohol, or using drugs to feel better. DIAGNOSIS  Stress is diagnosed through an assessment by your health care provider. Your health care provider will ask questions about your symptoms and any stressful life events.Your health care provider will also ask about your medical history and may order blood tests or other tests. Certain medical conditions and medicine can cause physical symptoms similar to stress. Mental illness can cause emotional symptoms and unhealthy behaviors similar to stress. Your health care provider may refer you to a mental health professional for further evaluation.  TREATMENT  Stress management is the recommended treatment for stress.The goals of stress management are reducing stressful life events and coping with stress in healthy ways.  Techniques for reducing stressful life events include the following:  Stress identification. Self-monitor for stress and identify what causes stress for you. These skills may help you to avoid some stressful events.  Time management. Set your priorities, keep a calendar of events, and learn to say "no." These tools can help you avoid making too many commitments. Techniques for coping with stress include the following:  Rethinking the problem. Try to think realistically about stressful events rather than ignoring them or overreacting. Try to find the positives in a stressful situation rather than focusing on the negatives.  Exercise. Physical exercise can release both physical and emotional tension. The key is to find a form of exercise you enjoy and do it regularly.  Relaxation techniques. These relax the body and mind. Examples include yoga, meditation, tai chi, biofeedback, deep  breathing, progressive muscle relaxation, listening to music, being out in nature, journaling, and other hobbies. Again, the key is to find one or more that you enjoy and can do  regularly.  Healthy lifestyle. Eat a balanced diet, get plenty of sleep, and do not smoke. Avoid using alcohol or drugs to relax.  Strong support network. Spend time with family, friends, or other people you enjoy being around.Express your feelings and talk things over with someone you trust. Counseling or talktherapy with a mental health professional may be helpful if you are having difficulty managing stress on your own. Medicine is typically not recommended for the treatment of stress.Talk to your health care provider if you think you need medicine for symptoms of stress. HOME CARE INSTRUCTIONS  Keep all follow-up visits as directed by your health care provider.  Take all medicines as directed by your health care provider. SEEK MEDICAL CARE IF:  Your symptoms get worse or you start having new symptoms.  You feel overwhelmed by your problems and can no longer manage them on your own. SEEK IMMEDIATE MEDICAL CARE IF:  You feel like hurting yourself or someone else. Document Released: 08/13/2000 Document Revised: 07/04/2013 Document Reviewed: 10/12/2012 Exit[employer] Patient Information 2015 [employer]. This information is not intended to replace advice given to you by your health care provider. Make sure you discuss any questions you have with your health care provider.  

## 2014-07-27 NOTE — Progress Notes (Signed)
   Subjective:    Patient ID: Clinton Evans, male    DOB: 10/18/1985, 29 y.o.   MRN: [id number]  HPI Patient in today for recheck of ADHD- he is currently on adderall20mg  daily which helps him to stay focused- no c/o side effects. 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 May need a note stating that ADHD meds will not alter his mental status.    Review of Systems  Constitutional: Negative.   HENT: Negative.   Respiratory: Negative.   Cardiovascular: Negative.   Gastrointestinal: Negative.   Genitourinary: Negative.   Neurological: Negative.   Psychiatric/Behavioral: Negative.   All other systems reviewed and are negative.      Objective:   Physical Exam  Constitutional: He is oriented to person, place, and time. He appears well-developed and well-nourished.  Cardiovascular: Normal rate, regular rhythm and normal heart sounds.   Pulmonary/Chest: Effort normal.  Neurological: He is alert and oriented to person, place, and time.  Skin: Skin is warm and dry.  Psychiatric: He has a normal mood and affect. His behavior is normal. Judgment and thought content normal.   BP 150/98 mmHg  Pulse 91  Temp(Src) 97 F (36.1 C) (Oral)  Ht 6\' 1"  (1.854 m)  Wt 161 lb (73.029 kg)  BMI 21.25 kg/m2         Assessment & Plan:   1. Adult ADHD    Meds ordered this encounter  Medications  . amphetamine-dextroamphetamine (ADDERALL) 20 MG tablet    Sig: Take 1 tablet (20 mg total) by mouth 2 (two) times daily.    Dispense:  60 tablet    Refill:  0    Do not fill till 08/26/14    Order Specific Question:  Supervising Provider    Answer:  Ernestina PennaMOORE, DONALD W [[id number]]  . amphetamine-dextroamphetamine (ADDERALL) 20 MG tablet    Sig: 2 po qd    Dispense:  60 tablet    Refill:  0    Do not fill till 09/24/14    Order Specific Question:  Supervising Provider    Answer:  Ernestina PennaMOORE, DONALD W [[id number]]  . amphetamine-dextroamphetamine (ADDERALL) 20 MG tablet    Sig: Take 1 tablet (20  mg total) by mouth 2 (two) times daily.    Dispense:  60 tablet    Refill:  0    Order Specific Question:  Supervising Provider    Answer:  Deborra MedinaMOORE, DONALD W [[id number]]   Stress management RTO in 3 months Keep diary of blood pressure  Mary-Margaret Daphine DeutscherMartin, FNP

## 2014-09-01 ENCOUNTER — Ambulatory Visit: Payer: 59 | Admitting: Nurse Practitioner

## 2014-10-30 ENCOUNTER — Ambulatory Visit: Payer: 59 | Admitting: Nurse Practitioner

## 2014-10-30 ENCOUNTER — Telehealth: Payer: Self-pay | Admitting: Nurse Practitioner

## 2014-10-30 DIAGNOSIS — F909 Attention-deficit hyperactivity disorder, unspecified type: Secondary | ICD-10-CM

## 2014-10-30 MED ORDER — AMPHETAMINE-DEXTROAMPHETAMINE 20 MG PO TABS: 20.0000 mg | ORAL_TABLET | Freq: Two times a day (BID) | ORAL | Status: DC

## 2014-10-30 NOTE — Telephone Encounter (Signed)
Stp's wife and advised rx ready for pick up.

## 2014-10-30 NOTE — Telephone Encounter (Signed)
aderall rx ready for pick up  

## 2014-10-31 ENCOUNTER — Encounter: Payer: Self-pay | Admitting: Nurse Practitioner

## 2014-11-30 ENCOUNTER — Ambulatory Visit (INDEPENDENT_AMBULATORY_CARE_PROVIDER_SITE_OTHER): Payer: Medicaid Other | Admitting: Family Medicine

## 2014-11-30 ENCOUNTER — Encounter: Payer: Self-pay | Admitting: Family Medicine

## 2014-11-30 VITALS — BP 149/91 | HR 117 | Temp 97.0°F | Ht 73.0 in | Wt 151.8 lb

## 2014-11-30 DIAGNOSIS — F900 Attention-deficit hyperactivity disorder, predominantly inattentive type: Secondary | ICD-10-CM | POA: Diagnosis not present

## 2014-11-30 DIAGNOSIS — F909 Attention-deficit hyperactivity disorder, unspecified type: Secondary | ICD-10-CM

## 2014-11-30 DIAGNOSIS — J0101 Acute recurrent maxillary sinusitis: Secondary | ICD-10-CM | POA: Diagnosis not present

## 2014-11-30 MED ORDER — AMOXICILLIN-POT CLAVULANATE 875-125 MG PO TABS: 1.0000 | ORAL_TABLET | Freq: Two times a day (BID) | ORAL | Status: DC

## 2014-11-30 MED ORDER — AMPHETAMINE-DEXTROAMPHETAMINE 20 MG PO TABS: ORAL_TABLET | ORAL | Status: DC

## 2014-11-30 NOTE — Patient Instructions (Signed)

## 2014-11-30 NOTE — Progress Notes (Signed)
   HPI  Patient presents today for concern of sinus infection  Explained she said 5 days of bilateral maxillary sinus pressure and tenderness, his right is slightly greater than left, he also has nasal congestion, malaise, and chills.  He denies any loss of appetite, overt fever, or cough. He is recovering from back surgery.  He needs a refill of his Adderall, he usually sees an provider that is out of the office this week.   PMH: Smoking status noted ROS: Per HPI  Objective: BP 149/91 mmHg  Pulse 117  Temp(Src) 97 F (36.1 C) (Oral)  Ht  (1.854 m)  Wt 151 lb 12.8 oz (68.856 kg)  BMI 20.03 kg/m2 Gen: NAD, alert, cooperative with exam HEENT: NCAT, erythematous nasal mucosa, and mild turbinate swelling, TMs NL BL, oropharynx clear, bilateral maxillary sinus tenderness to palpation CV: RRR, good S1/S2, no murmur Resp: CTABL, no wheezes, non-labored Ext: No edema, warm Neuro: Alert and oriented, No gross deficits  Assessment and plan:  # Acute maxillary sinusitis Treat with Augmentin, discussed usual course Consider Neti pot and daily Claritin  ADHD Refilled 1 month, recommended following up with his PCP next month prior to need for additional refills  Meds ordered this encounter  Medications  . amoxicillin-clavulanate (AUGMENTIN) 875-125 MG tablet    Sig: Take 1 tablet by mouth 2 (two) times daily.    Dispense:  20 tablet    Refill:  0  . amphetamine-dextroamphetamine (ADDERALL) 20 MG tablet    Sig: 2 po qd    Dispense:  60 tablet    Refill:  0    Murtis Sink, MD [place] [facility] 11/30/2014, 4:39 PM

## 2014-12-29 ENCOUNTER — Encounter: Payer: Self-pay | Admitting: Nurse Practitioner

## 2014-12-29 ENCOUNTER — Ambulatory Visit (INDEPENDENT_AMBULATORY_CARE_PROVIDER_SITE_OTHER): Payer: Medicaid Other | Admitting: Nurse Practitioner

## 2014-12-29 VITALS — BP 136/86 | HR 110 | Temp 97.7°F | Ht 73.0 in | Wt 160.0 lb

## 2014-12-29 DIAGNOSIS — F900 Attention-deficit hyperactivity disorder, predominantly inattentive type: Secondary | ICD-10-CM

## 2014-12-29 DIAGNOSIS — F909 Attention-deficit hyperactivity disorder, unspecified type: Secondary | ICD-10-CM

## 2014-12-29 MED ORDER — AMPHETAMINE-DEXTROAMPHETAMINE 20 MG PO TABS: ORAL_TABLET | ORAL | Status: DC

## 2014-12-29 MED ORDER — AMPHETAMINE-DEXTROAMPHETAMINE 20 MG PO TABS: 20.0000 mg | ORAL_TABLET | Freq: Two times a day (BID) | ORAL | Status: DC

## 2014-12-29 NOTE — Progress Notes (Signed)
   Subjective:    Patient ID: Clinton Evans, male    DOB: 02/20/1986, 29 y.o.   MRN: [id number]  HPI Patient in today for adult ADHD recheck- he has been off meds for awhile because he has been out of work due to back injury and surgery. He is climbing trees again. He needs to be back on his adderall. He has no complaints today.    Review of Systems  Constitutional: Negative.   HENT: Negative.   Respiratory: Negative.   Cardiovascular: Negative.   Genitourinary: Negative.   Neurological: Negative.   Psychiatric/Behavioral: Negative.   All other systems reviewed and are negative.      Objective:   Physical Exam  Constitutional: He is oriented to person, place, and time. He appears well-developed and well-nourished.  Neck: Normal range of motion.  Cardiovascular: Normal rate, regular rhythm and normal heart sounds.   Pulmonary/Chest: Effort normal.  Abdominal: Soft. Bowel sounds are normal.  Neurological: He is alert and oriented to person, place, and time.  Skin: Skin is warm and dry.  Psychiatric: He has a normal mood and affect. His behavior is normal. Judgment and thought content normal.    BP 136/86 mmHg  Pulse 110  Temp(Src) 97.7 F (36.5 C) (Oral)  Ht 6\' 1"  (1.854 m)  Wt 160 lb (72.576 kg)  BMI 21.11 kg/m2       Assessment & Plan:   1. Adult ADHD    Meds ordered this encounter  Medications  . amphetamine-dextroamphetamine (ADDERALL) 20 MG tablet    Sig: 2 po qd    Dispense:  60 tablet    Refill:  0    Order Specific Question:  Supervising Provider    Answer:  Ernestina PennaMOORE, DONALD W [[id number]]  . amphetamine-dextroamphetamine (ADDERALL) 20 MG tablet    Sig: Take 1 tablet (20 mg total) by mouth 2 (two) times daily.    Dispense:  60 tablet    Refill:  0    Order Specific Question:  Supervising Provider    Answer:  Deborra MedinaMOORE, DONALD W [[id number]]   Meds as prescribed Behavior modification as needed Follow-up for recheck in 2 months  Mary-Margaret Daphine DeutscherMartin, FNP

## 2015-01-30 ENCOUNTER — Ambulatory Visit: Payer: Medicaid Other

## 2015-01-30 ENCOUNTER — Other Ambulatory Visit: Payer: Self-pay | Admitting: Nurse Practitioner

## 2015-01-30 DIAGNOSIS — F1110 Opioid abuse, uncomplicated: Secondary | ICD-10-CM

## 2015-01-30 DIAGNOSIS — F909 Attention-deficit hyperactivity disorder, unspecified type: Secondary | ICD-10-CM

## 2015-01-30 DIAGNOSIS — Z9229 Personal history of other drug therapy: Secondary | ICD-10-CM | POA: Insufficient documentation

## 2015-02-27 ENCOUNTER — Telehealth: Payer: Self-pay | Admitting: Nurse Practitioner

## 2015-02-27 NOTE — Telephone Encounter (Signed)
Patient aware to make appoitment to discuss changing from adderall to vyvanse

## 2015-02-27 NOTE — Telephone Encounter (Signed)
Cannot have adderall rx due to narcotic abuse hx

## 2015-02-27 NOTE — Telephone Encounter (Signed)
Patient requests refill of Adderall

## 2015-03-06 ENCOUNTER — Encounter: Payer: Self-pay | Admitting: Nurse Practitioner

## 2015-03-06 ENCOUNTER — Ambulatory Visit (INDEPENDENT_AMBULATORY_CARE_PROVIDER_SITE_OTHER): Payer: Medicaid Other | Admitting: Nurse Practitioner

## 2015-03-06 VITALS — BP 150/84 | HR 103 | Temp 97.6°F | Ht 73.0 in | Wt 158.2 lb

## 2015-03-06 DIAGNOSIS — F900 Attention-deficit hyperactivity disorder, predominantly inattentive type: Secondary | ICD-10-CM | POA: Diagnosis not present

## 2015-03-06 DIAGNOSIS — I10 Essential (primary) hypertension: Secondary | ICD-10-CM | POA: Diagnosis not present

## 2015-03-06 DIAGNOSIS — L02439 Carbuncle of limb, unspecified: Secondary | ICD-10-CM | POA: Diagnosis not present

## 2015-03-06 DIAGNOSIS — F909 Attention-deficit hyperactivity disorder, unspecified type: Secondary | ICD-10-CM

## 2015-03-06 DIAGNOSIS — L02429 Furuncle of limb, unspecified: Secondary | ICD-10-CM

## 2015-03-06 MED ORDER — LISINOPRIL 10 MG PO TABS: 10.0000 mg | ORAL_TABLET | Freq: Every day | ORAL | Status: DC

## 2015-03-06 MED ORDER — LISDEXAMFETAMINE DIMESYLATE 30 MG PO CAPS: 30.0000 mg | ORAL_CAPSULE | Freq: Every day | ORAL | Status: DC

## 2015-03-06 MED ORDER — CEPHALEXIN 500 MG PO CAPS: 500.0000 mg | ORAL_CAPSULE | Freq: Four times a day (QID) | ORAL | Status: DC

## 2015-03-06 MED ORDER — LISDEXAMFETAMINE DIMESYLATE 30 MG PO CAPS: 30.0000 mg | ORAL_CAPSULE | ORAL | Status: DC

## 2015-03-06 NOTE — Progress Notes (Addendum)
   Subjective:    Patient ID: Eustaquio BoydenEric A Teagle, male    DOB: 08/08/1985, 30 y.o.   MRN: [id number]  HPI Patient in today with 2 complaints: - boil right axillary area- started several days ago- has gotten bigger and very painful. -Adult ADHD- patient heas recently gone through drug rehab for narcotic abuse. He was on adderall during this time and was taking that more then he should. He is doing much better- has not had any narcotics for greater than a month. His ADHD is so bad that he feels like he is gping to jump out of his skin. His counselor thinks he will do better if he gets back on his ADHD meds.    Review of Systems  Constitutional: Negative.   HENT: Negative.   Respiratory: Negative.   Cardiovascular: Negative.   Gastrointestinal: Negative.   Genitourinary: Negative.   Neurological: Negative.   Psychiatric/Behavioral: Negative.   All other systems reviewed and are negative.      Objective:   Physical Exam  Constitutional: He appears well-developed and well-nourished.  Cardiovascular: Normal rate, regular rhythm and normal heart sounds.   Pulmonary/Chest: Effort normal.  Neurological: He is alert.  Skin: Skin is warm.  Psychiatric: He has a normal mood and affect. His behavior is normal. Judgment and thought content normal.    BP 150/84 mmHg  Pulse 103  Temp(Src) 97.6 F (36.4 C) (Oral)  Ht 6\' 1"  (1.854 m)  Wt 158 lb 3.2 oz (71.759 kg)  BMI 20.88 kg/m2  Procedure: Lidocaine !% with epi  Betadine prep  #15 blade-lanced area  Copious amount of pruluent drainage  Dressing applied     Assessment & Plan:  1. Adult ADHD Stress management - lisdexamfetamine (VYVANSE) 30 MG capsule; Take 1 capsule (30 mg total) by mouth every morning.  Dispense: 30 capsule; Refill: 0 - lisdexamfetamine (VYVANSE) 30 MG capsule; Take 1 capsule (30 mg total) by mouth daily.  Dispense: 30 capsule; Refill: 0  2. Boil, axilla Warm compresses - cephALEXin (KEFLEX) 500 MG capsule; Take 1  capsule (500 mg total) by mouth 4 (four) times daily.  Dispense: 30 capsule; Refill: 0  3. Hypertension Lisinopril 10mg  daily Do not add salt to diet  Mary-Margaret Daphine DeutscherMartin, FNP

## 2015-03-06 NOTE — Patient Instructions (Signed)

## 2015-03-06 NOTE — Addendum Note (Signed)
Addended by: Bennie PieriniMARTIN, MARY-MARGARET on: 03/06/2015 08:27 AM   Modules accepted: Orders

## 2015-03-07 ENCOUNTER — Ambulatory Visit: Payer: Medicaid Other | Admitting: Nurse Practitioner

## 2015-03-12 ENCOUNTER — Encounter: Payer: Self-pay | Admitting: Nurse Practitioner

## 2015-03-12 ENCOUNTER — Ambulatory Visit (INDEPENDENT_AMBULATORY_CARE_PROVIDER_SITE_OTHER): Payer: Medicaid Other | Admitting: Nurse Practitioner

## 2015-03-12 VITALS — BP 135/77 | HR 81 | Temp 96.8°F | Ht 73.0 in | Wt 163.0 lb

## 2015-03-12 DIAGNOSIS — J0100 Acute maxillary sinusitis, unspecified: Secondary | ICD-10-CM | POA: Diagnosis not present

## 2015-03-12 MED ORDER — AZITHROMYCIN 250 MG PO TABS: ORAL_TABLET | ORAL | Status: DC

## 2015-03-12 NOTE — Patient Instructions (Signed)

## 2015-03-12 NOTE — Progress Notes (Signed)
  Subjective:     Clinton Evans is a 30 y.o. male who presents for evaluation of sinus pain. Symptoms include: clear rhinorrhea, congestion, facial pain, headaches, sinus pressure and sore throat. Onset of symptoms was 4 days ago. Symptoms have been unchanged since that time. Past history is significant for no history of pneumonia or bronchitis. Patient is a smoker  (1 ppd x 12 yrs).  The following portions of the patient's history were reviewed and updated as appropriate: allergies, current medications, past family history, past medical history, past social history, past surgical history and problem list.  Review of Systems Pertinent items are noted in HPI.   Objective:    BP 135/77 mmHg  Pulse 81  Temp(Src) 96.8 F (36 C) (Oral)  Ht 6\' 1"  (1.854 m)  Wt 163 lb (73.936 kg)  BMI 21.51 kg/m2 General appearance: alert and cooperative Eyes: conjunctivae/corneas clear. PERRL, EOM's intact. Fundi benign. Ears: normal TM's and external ear canals both ears Nose: purulent discharge, moderate congestion, turbinates red, sinus tenderness bilateral Throat: lips, mucosa, and tongue normal; teeth and gums normal Neck: no adenopathy, no carotid bruit, no JVD, supple, symmetrical, trachea midline and thyroid not enlarged, symmetric, no tenderness/mass/nodules Lungs: clear to auscultation bilaterally Heart: regular rate and rhythm, S1, S2 normal, no murmur, click, rub or gallop    Assessment:    Acute bacterial sinusitis.    Plan:   1. Take meds as prescribed 2. Use a cool mist humidifier especially during the winter months and when heat has been humid. 3. Use saline nose sprays frequently 4. Saline irrigations of the nose can be very helpful if done frequently.  * 4X daily for 1 week*  * Use of a nettie pot can be helpful with this. Follow directions with this* 5. Drink plenty of fluids 6. Keep thermostat turn down low 7.For any cough or congestion  Use plain Mucinex- regular strength or  max strength is fine   * Children- consult with Pharmacist for dosing 8. For fever or aces or pains- take tylenol or ibuprofen appropriate for age and weight.  * for fevers greater than 101 orally you may alternate ibuprofen and tylenol every  3 hours.    Meds ordered this encounter  Medications  . azithromycin (ZITHROMAX) 250 MG tablet    Sig: Two tablets day one, then one tablet daily next 4 days.    Dispense:  6 tablet    Refill:  0    Order Specific Question:  Supervising Provider    Answer:  Ernestina PennaMOORE, DONALD W [[id number]]   Mary-Margaret Daphine DeutscherMartin, FNP

## 2015-03-13 ENCOUNTER — Ambulatory Visit: Payer: Medicaid Other | Admitting: Nurse Practitioner

## 2015-03-16 ENCOUNTER — Ambulatory Visit: Payer: Medicaid Other | Admitting: Nurse Practitioner

## 2015-04-26 ENCOUNTER — Other Ambulatory Visit: Payer: Self-pay | Admitting: Nurse Practitioner

## 2015-04-26 MED ORDER — SULFAMETHOXAZOLE-TRIMETHOPRIM 800-160 MG PO TABS: 1.0000 | ORAL_TABLET | Freq: Two times a day (BID) | ORAL | Status: DC

## 2015-05-11 ENCOUNTER — Other Ambulatory Visit: Payer: Self-pay | Admitting: Nurse Practitioner

## 2015-05-11 DIAGNOSIS — F909 Attention-deficit hyperactivity disorder, unspecified type: Secondary | ICD-10-CM

## 2015-05-11 MED ORDER — LISDEXAMFETAMINE DIMESYLATE 30 MG PO CAPS: 30.0000 mg | ORAL_CAPSULE | ORAL | Status: DC

## 2015-05-11 NOTE — Telephone Encounter (Signed)
vyvanse rx ready for pick up  

## 2015-05-11 NOTE — Telephone Encounter (Signed)
rx called into pharmacy

## 2015-05-14 ENCOUNTER — Ambulatory Visit: Payer: Medicaid Other | Admitting: Nurse Practitioner

## 2015-05-15 ENCOUNTER — Encounter: Payer: Self-pay | Admitting: Nurse Practitioner

## 2015-07-03 ENCOUNTER — Other Ambulatory Visit: Payer: Self-pay | Admitting: Nurse Practitioner

## 2015-07-03 MED ORDER — SULFAMETHOXAZOLE-TRIMETHOPRIM 800-160 MG PO TABS: 1.0000 | ORAL_TABLET | Freq: Two times a day (BID) | ORAL | Status: DC

## 2015-09-06 ENCOUNTER — Ambulatory Visit (INDEPENDENT_AMBULATORY_CARE_PROVIDER_SITE_OTHER): Payer: Medicaid Other | Admitting: Family

## 2015-09-06 ENCOUNTER — Encounter: Payer: Self-pay | Admitting: Family

## 2015-09-06 VITALS — BP 131/80 | HR 89 | Temp 97.7°F | Ht 73.0 in | Wt 165.2 lb

## 2015-09-06 DIAGNOSIS — L03114 Cellulitis of left upper limb: Secondary | ICD-10-CM

## 2015-09-06 DIAGNOSIS — L02414 Cutaneous abscess of left upper limb: Secondary | ICD-10-CM | POA: Diagnosis not present

## 2015-09-06 DIAGNOSIS — L03116 Cellulitis of left lower limb: Secondary | ICD-10-CM | POA: Diagnosis not present

## 2015-09-06 MED ORDER — DOXYCYCLINE HYCLATE 100 MG PO TABS: 100.0000 mg | ORAL_TABLET | Freq: Two times a day (BID) | ORAL | Status: DC

## 2015-09-06 MED ORDER — CEFTRIAXONE SODIUM 1 G IJ SOLR
1.0000 g | INTRAMUSCULAR | Status: AC
  Administered 2015-09-06: 1 g via INTRAMUSCULAR

## 2015-09-06 NOTE — Patient Instructions (Signed)

## 2015-09-06 NOTE — Progress Notes (Signed)
   Subjective:    Patient ID: Clinton Evans, male    DOB: 11/20/1985, 30 y.o.   MRN: [id number]  HPI Pt presents to the office today with an abscess in left forearm with cellulitis and left knee cellulitis . Pt was given rocephin injection and given rx of doxycycline. Pt states he was not able to pick up the doxycycline because the pharmacy was closed and he knew he had an appointment here. PT states his left knee started draining this morning a serous yellow drainage. PT states he is having a 6 out 10.    Review of Systems  Musculoskeletal: Positive for joint swelling.  All other systems reviewed and are negative.      Objective:   Physical Exam  Constitutional: He is oriented to person, place, and time. He appears well-developed and well-nourished. No distress.  HENT:  Head: Normocephalic.  Cardiovascular: Normal rate, regular rhythm, normal heart sounds and intact distal pulses.   No murmur heard. Pulmonary/Chest: Effort normal and breath sounds normal. No respiratory distress. He has no wheezes.  Abdominal: Soft. Bowel sounds are normal. He exhibits no distension. There is no tenderness.  Musculoskeletal: Normal range of motion. He exhibits no edema or tenderness.  Neurological: He is alert and oriented to person, place, and time.  Skin: Skin is warm and dry. Rash noted. No erythema.  Psychiatric: He has a normal mood and affect. His behavior is normal. Judgment and thought content normal.  Vitals reviewed.  Verbal consent given by patient. Local anesthesia Lidocaine 2% with 2ml Betadine prep Left forearm small incision made and purulent sanguineous fluid Cleaned with Saline Antibiotic ointment Dressing applied   BP 131/80 mmHg  Pulse 89  Temp(Src) 97.7 F (36.5 C) (Oral)  Ht 6\' 1"  (1.854 m)  Wt 165 lb 3.2 oz (74.934 kg)  BMI 21.80 kg/m2      Assessment & Plan:  1. Cellulitis of left knee - cefTRIAXone (ROCEPHIN) injection 1 g; Inject 1 g into the muscle now. -  doxycycline (VIBRA-TABS) 100 MG tablet; Take 1 tablet (100 mg total) by mouth 2 (two) times daily.  Dispense: 20 tablet; Refill: 0  2. Abscess of forearm, left - cefTRIAXone (ROCEPHIN) injection 1 g; Inject 1 g into the muscle now. - doxycycline (VIBRA-TABS) 100 MG tablet; Take 1 tablet (100 mg total) by mouth 2 (two) times daily.  Dispense: 20 tablet; Refill: 0  3. Cellulitis of left arm - cefTRIAXone (ROCEPHIN) injection 1 g; Inject 1 g into the muscle now. - doxycycline (VIBRA-TABS) 100 MG tablet; Take 1 tablet (100 mg total) by mouth 2 (two) times daily.  Dispense: 20 tablet; Refill: 0  Area marked- Pt to return if redness increases, warmth Do not squeeze or pick at Keep clean and dry PT to start doxycycline today!!! RTO 10 days  Jannifer Rodneyhristy [place] Dani, FNP

## 2015-09-14 ENCOUNTER — Ambulatory Visit: Payer: Medicaid Other | Admitting: Family

## 2015-09-18 ENCOUNTER — Encounter: Payer: Self-pay | Admitting: Nurse Practitioner

## 2016-04-29 ENCOUNTER — Ambulatory Visit (INDEPENDENT_AMBULATORY_CARE_PROVIDER_SITE_OTHER): Payer: Medicaid Other | Admitting: Family

## 2016-04-29 ENCOUNTER — Encounter: Payer: Self-pay | Admitting: Family

## 2016-04-29 VITALS — BP 126/85 | HR 78 | Temp 97.0°F | Ht 73.0 in | Wt 166.4 lb

## 2016-04-29 DIAGNOSIS — F17200 Nicotine dependence, unspecified, uncomplicated: Secondary | ICD-10-CM

## 2016-04-29 DIAGNOSIS — F32A Depression, unspecified: Secondary | ICD-10-CM | POA: Insufficient documentation

## 2016-04-29 DIAGNOSIS — G8929 Other chronic pain: Secondary | ICD-10-CM

## 2016-04-29 DIAGNOSIS — M545 Low back pain, unspecified: Secondary | ICD-10-CM

## 2016-04-29 DIAGNOSIS — Z23 Encounter for immunization: Secondary | ICD-10-CM | POA: Diagnosis not present

## 2016-04-29 DIAGNOSIS — Z0000 Encounter for general adult medical examination without abnormal findings: Secondary | ICD-10-CM

## 2016-04-29 DIAGNOSIS — M549 Dorsalgia, unspecified: Secondary | ICD-10-CM

## 2016-04-29 DIAGNOSIS — F331 Major depressive disorder, recurrent, moderate: Secondary | ICD-10-CM

## 2016-04-29 DIAGNOSIS — Z114 Encounter for screening for human immunodeficiency virus [HIV]: Secondary | ICD-10-CM

## 2016-04-29 DIAGNOSIS — F329 Major depressive disorder, single episode, unspecified: Secondary | ICD-10-CM | POA: Insufficient documentation

## 2016-04-29 HISTORY — DX: Depression, unspecified: F32.A

## 2016-04-29 NOTE — Progress Notes (Signed)
   Subjective:    Patient ID: Clinton Evans, male    DOB: [birth year], 31 y.o.   MRN: [id number]  Pt presents to the office today for CPE. PT currently not taking any medications at this time. PT states he has chronic back pain that is constantly. PT states his pain is 4-5 out 10 that is worse in the morning. Pt also is going to behavior health for depression. Back Pain  This is a chronic problem. The current episode started more than 1 year ago. The problem occurs constantly. The problem has been waxing and waning since onset. The pain is present in the lumbar spine. The quality of the pain is described as aching. The pain is at a severity of 5/10. The pain is moderate. Pertinent negatives include no bladder incontinence, bowel incontinence or leg pain. He has tried bed rest and NSAIDs for the symptoms. The treatment provided mild relief.  Depression         This is a chronic problem.  The current episode started more than 1 year ago.   The onset quality is gradual.   Associated symptoms include helplessness, hopelessness, irritable, decreased interest and sad.  Associated symptoms include no suicidal ideas.  Past treatments include nothing.     Review of Systems  Gastrointestinal: Negative for bowel incontinence.  Genitourinary: Negative for bladder incontinence.  Musculoskeletal: Positive for back pain.  Psychiatric/Behavioral: Positive for depression. Negative for suicidal ideas.  All other systems reviewed and are negative.      Objective:   Physical Exam  Constitutional: He is oriented to person, place, and time. He appears well-developed and well-nourished. He is irritable. No distress.  HENT:  Head: Normocephalic.  Right Ear: External ear normal.  Left Ear: External ear normal.  Nose: Nose normal.  Mouth/Throat: Oropharynx is clear and moist.  Eyes: Pupils are equal, round, and reactive to light. Right eye exhibits no discharge. Left eye exhibits no discharge.  Neck: Normal range  of motion. Neck supple. No thyromegaly present.  Cardiovascular: Normal rate, regular rhythm, normal heart sounds and intact distal pulses.   No murmur heard. Pulmonary/Chest: Effort normal and breath sounds normal. No respiratory distress. He has no wheezes.  Abdominal: Soft. Bowel sounds are normal. He exhibits no distension. There is no tenderness.  Musculoskeletal: Normal range of motion. He exhibits no edema or tenderness.  Neurological: He is alert and oriented to person, place, and time.  Skin: Skin is warm and dry. No rash noted. No erythema.  Psychiatric: He has a normal mood and affect. His behavior is normal. Judgment and thought content normal.  Vitals reviewed.     BP 126/85   Pulse 78   Temp 97 F (36.1 C) (Oral)   Ht _0  (1.854 m)   Wt 166 lb 6.4 oz (75.5 kg)   BMI 21.95 kg/m      Assessment & Plan:  1. Moderate episode of recurrent major depressive disorder (HCC) - CMP14+EGFR  2. Chronic bilateral low back pain without sciatica - CMP14+EGFR  3. Current smoker - CMP14+EGFR  4. Annual physical exam - CMP14+EGFR - CBC with Differential/Platelet - Lipid panel - Thyroid Panel With TSH - VITAMIN D 25 Hydroxy (Vit-D Deficiency, Fractures) - HIV antibody  5. Encounter for screening for HIV - HIV antibody   Continue all meds Labs pending Health Maintenance reviewed-TDAP Diet and exercise encouraged RTO 1 year  Evelina Dun, FNP

## 2016-04-29 NOTE — Addendum Note (Signed)
Addended by: Almeta MonasSTONE, JANIE M on: 04/29/2016 08:51 AM   Modules accepted: Orders

## 2016-04-29 NOTE — Patient Instructions (Signed)
 Health Maintenance, Male A healthy lifestyle and preventive care is important for your health and wellness. Ask your health care provider about what schedule of regular examinations is right for you. What should I know about weight and diet?  Eat a Healthy Diet  Eat plenty of vegetables, fruits, whole grains, low-fat dairy products, and lean protein.  Do not eat a lot of foods high in solid fats, added sugars, or salt. Maintain a Healthy Weight  Regular exercise can help you achieve or maintain a healthy weight. You should:  Do at least 150 minutes of exercise each week. The exercise should increase your heart rate and make you sweat (moderate-intensity exercise).  Do strength-training exercises at least twice a week. Watch Your Levels of Cholesterol and Blood Lipids  Have your blood tested for lipids and cholesterol every 5 years starting at 31 years of age. If you are at high risk for heart disease, you should start having your blood tested when you are 31 years old. You may need to have your cholesterol levels checked more often if:  Your lipid or cholesterol levels are high.  You are older than 31 years of age.  You are at high risk for heart disease. What should I know about cancer screening? Many types of cancers can be detected early and may often be prevented. Lung Cancer  You should be screened every year for lung cancer if:  You are a current smoker who has smoked for at least 30 years.  You are a former smoker who has quit within the past 15 years.  Talk to your health care provider about your screening options, when you should start screening, and how often you should be screened. Colorectal Cancer  Routine colorectal cancer screening usually begins at 31 years of age and should be repeated every 5-10 years until you are 31 years old. You may need to be screened more often if early forms of precancerous polyps or small growths are found. Your health care provider  may recommend screening at an earlier age if you have risk factors for colon cancer.  Your health care provider may recommend using home test kits to check for hidden blood in the stool.  A small camera at the end of a tube can be used to examine your colon (sigmoidoscopy or colonoscopy). This checks for the earliest forms of colorectal cancer. Prostate and Testicular Cancer  Depending on your age and overall health, your health care provider may do certain tests to screen for prostate and testicular cancer.  Talk to your health care provider about any symptoms or concerns you have about testicular or prostate cancer. Skin Cancer  Check your skin from head to toe regularly.  Tell your health care provider about any new moles or changes in moles, especially if:  There is a change in a mole's size, shape, or color.  You have a mole that is larger than a pencil eraser.  Always use sunscreen. Apply sunscreen liberally and repeat throughout the day.  Protect yourself by wearing long sleeves, pants, a wide-brimmed hat, and sunglasses when outside. What should I know about heart disease, diabetes, and high blood pressure?  If you are 18-39 years of age, have your blood pressure checked every 3-5 years. If you are 40 years of age or older, have your blood pressure checked every year. You should have your blood pressure measured twice-once when you are at a hospital or clinic, and once when you are not at   a hospital or clinic. Record the average of the two measurements. To check your blood pressure when you are not at a hospital or clinic, you can use:  An automated blood pressure machine at a pharmacy.  A home blood pressure monitor.  Talk to your health care provider about your target blood pressure.  If you are between 45-79 years old, ask your health care provider if you should take aspirin to prevent heart disease.  Have regular diabetes screenings by checking your fasting blood sugar  level.  If you are at a normal weight and have a low risk for diabetes, have this test once every three years after the age of 45.  If you are overweight and have a high risk for diabetes, consider being tested at a younger age or more often.  A one-time screening for abdominal aortic aneurysm (AAA) by ultrasound is recommended for men aged 65-75 years who are current or former smokers. What should I know about preventing infection? Hepatitis B  If you have a higher risk for hepatitis B, you should be screened for this virus. Talk with your health care provider to find out if you are at risk for hepatitis B infection. Hepatitis C  Blood testing is recommended for:  Everyone born from 1945 through 1965.  Anyone with known risk factors for hepatitis C. Sexually Transmitted Diseases (STDs)  You should be screened each year for STDs including gonorrhea and chlamydia if:  You are sexually active and are younger than 31 years of age.  You are older than 31 years of age and your health care provider tells you that you are at risk for this type of infection.  Your sexual activity has changed since you were last screened and you are at an increased risk for chlamydia or gonorrhea. Ask your health care provider if you are at risk.  Talk with your health care provider about whether you are at high risk of being infected with HIV. Your health care provider may recommend a prescription medicine to help prevent HIV infection. What else can I do?  Schedule regular health, dental, and eye exams.  Stay current with your vaccines (immunizations).  Do not use any tobacco products, such as cigarettes, chewing tobacco, and e-cigarettes. If you need help quitting, ask your health care provider.  Limit alcohol intake to no more than 2 drinks per day. One drink equals 12 ounces of beer, 5 ounces of wine, or 1 ounces of hard liquor.  Do not use street drugs.  Do not share needles.  Ask your health  care provider for help if you need support or information about quitting drugs.  Tell your health care provider if you often feel depressed.  Tell your health care provider if you have ever been abused or do not feel safe at home. This information is not intended to replace advice given to you by your health care provider. Make sure you discuss any questions you have with your health care provider. Document Released: 08/16/2007 Document Revised: 10/17/2015 Document Reviewed: 11/21/2014 Elsevier Interactive Patient Education  2017 [employer]  

## 2016-04-30 LAB — CMP14+EGFR
ALBUMIN: 4.5 g/dL (ref 3.5–5.5)
ALK PHOS: 56 IU/L (ref 39–117)
ALT: 25 IU/L (ref 0–44)
AST: 27 IU/L (ref 0–40)
Albumin/Globulin Ratio: 1.8 (ref 1.2–2.2)
BILIRUBIN TOTAL: 0.3 mg/dL (ref 0.0–1.2)
BUN / CREAT RATIO: 7 — AB (ref 9–20)
BUN: 7 mg/dL (ref 6–20)
CHLORIDE: 102 mmol/L (ref 96–106)
CO2: 25 mmol/L (ref 18–29)
CREATININE: 0.97 mg/dL (ref 0.76–1.27)
Calcium: 9.6 mg/dL (ref 8.7–10.2)
GFR calc Af Amer: 121 mL/min/{1.73_m2} (ref >59)
GFR calc non Af Amer: 104 mL/min/{1.73_m2} (ref >59)
GLUCOSE: 60 mg/dL — AB (ref 65–99)
Globulin, Total: 2.5 g/dL (ref 1.5–4.5)
Potassium: 5.3 mmol/L — ABNORMAL HIGH (ref 3.5–5.2)
Sodium: 144 mmol/L (ref 134–144)
Total Protein: 7 g/dL (ref 6.0–8.5)

## 2016-04-30 LAB — CBC WITH DIFFERENTIAL/PLATELET
BASOS ABS: 0 10*3/uL (ref 0.0–0.2)
Basos: 1 %
EOS (ABSOLUTE): 0.2 10*3/uL (ref 0.0–0.4)
EOS: 2 %
HEMATOCRIT: 48.7 % (ref 37.5–51.0)
Hemoglobin: 16.7 g/dL (ref 13.0–17.7)
IMMATURE GRANULOCYTES: 0 %
Immature Grans (Abs): 0 10*3/uL (ref 0.0–0.1)
LYMPHS ABS: 2.9 10*3/uL (ref 0.7–3.1)
Lymphs: 36 %
MCH: 31 pg (ref 26.6–33.0)
MCHC: 34.3 g/dL (ref 31.5–35.7)
MCV: 91 fL (ref 79–97)
MONOCYTES: 7 %
MONOS ABS: 0.6 10*3/uL (ref 0.1–0.9)
NEUTROS PCT: 54 %
Neutrophils Absolute: 4.3 10*3/uL (ref 1.4–7.0)
Platelets: 341 10*3/uL (ref 150–379)
RBC: 5.38 x10E6/uL (ref 4.14–5.80)
RDW: 13.6 % (ref 12.3–15.4)
WBC: 7.9 10*3/uL (ref 3.4–10.8)

## 2016-04-30 LAB — LIPID PANEL
CHOLESTEROL TOTAL: 131 mg/dL (ref 100–199)
Chol/HDL Ratio: 1.8 ratio units (ref 0.0–5.0)
HDL: 71 mg/dL (ref >39)
LDL Calculated: 45 mg/dL (ref 0–99)
TRIGLYCERIDES: 74 mg/dL (ref 0–149)
VLDL Cholesterol Cal: 15 mg/dL (ref 5–40)

## 2016-04-30 LAB — THYROID PANEL WITH TSH
FREE THYROXINE INDEX: 2.2 (ref 1.2–4.9)
T3 UPTAKE RATIO: 30 % (ref 24–39)
T4 TOTAL: 7.3 ug/dL (ref 4.5–12.0)
TSH: 1.26 u[IU]/mL (ref 0.450–4.500)

## 2016-04-30 LAB — HIV ANTIBODY (ROUTINE TESTING W REFLEX): HIV SCREEN 4TH GENERATION: NONREACTIVE

## 2016-04-30 LAB — VITAMIN D 25 HYDROXY (VIT D DEFICIENCY, FRACTURES): VIT D 25 HYDROXY: 21.8 ng/mL — AB (ref 30.0–100.0)

## 2016-05-01 ENCOUNTER — Other Ambulatory Visit: Payer: Self-pay | Admitting: Family

## 2016-05-01 DIAGNOSIS — E559 Vitamin D deficiency, unspecified: Secondary | ICD-10-CM | POA: Insufficient documentation

## 2016-05-01 MED ORDER — VITAMIN D (ERGOCALCIFEROL) 1.25 MG (50000 UNIT) PO CAPS: 50000.0000 [IU] | ORAL_CAPSULE | ORAL | 3 refills | Status: DC

## 2016-05-05 ENCOUNTER — Telehealth: Payer: Self-pay | Admitting: Nurse Practitioner

## 2016-05-05 NOTE — Telephone Encounter (Signed)
Pt aware Labs reviewed

## 2016-05-22 ENCOUNTER — Telehealth: Payer: Self-pay | Admitting: Nurse Practitioner

## 2016-05-22 MED ORDER — ESCITALOPRAM OXALATE 10 MG PO TABS: 10.0000 mg | ORAL_TABLET | Freq: Every day | ORAL | 1 refills | Status: DC

## 2016-05-22 NOTE — Telephone Encounter (Signed)
Lexapro Prescription sent to pharmacy. Needs follow up in 6 weeks to recheck.

## 2016-05-22 NOTE — Telephone Encounter (Signed)
Aware Rx sent to pharmacy, will call back for appt

## 2019-08-09 ENCOUNTER — Encounter: Payer: Self-pay | Admitting: Physician Assistant

## 2019-08-09 ENCOUNTER — Ambulatory Visit
Admission: EM | Admit: 2019-08-09 | Discharge: 2019-08-09 | Disposition: A | Discharge status: Home / Self Care | Payer: BC Managed Care – PPO | Attending: Physician Assistant | Admitting: Physician Assistant

## 2019-08-09 ENCOUNTER — Other Ambulatory Visit: Payer: Self-pay

## 2019-08-09 DIAGNOSIS — R0789 Other chest pain: Secondary | ICD-10-CM

## 2019-08-09 MED ORDER — METHOCARBAMOL 500 MG PO TABS: 500.0000 mg | ORAL_TABLET | Freq: Two times a day (BID) | ORAL | 0 refills | Status: DC

## 2019-08-09 MED ORDER — DICLOFENAC SODIUM 50 MG PO TBEC: 50.0000 mg | DELAYED_RELEASE_TABLET | Freq: Two times a day (BID) | ORAL | 0 refills | Status: DC

## 2019-08-09 NOTE — Discharge Instructions (Signed)
Start diclofenac. Do not take ibuprofen (motrin/advil)/ naproxen (aleve) while on diclofenac. Robaxin as needed, this can make you drowsy, so do not take if you are going to drive, operate heavy machinery, or make important decisions. Ice/heat compresses as needed. This can take up to 3-4 weeks to completely resolve, but you should be feeling better each week. Follow up with PCP if symptoms worsen, changes for reevaluation. If experiencing sudden worsening of chest pain, shortness of breath, trouble breathing, go to the ED For further evaluation needed.

## 2019-08-09 NOTE — ED Triage Notes (Signed)
Pt c/o constant lt sided chest pain since last night that has worsen over the day. Pt states tender to touch, pain to take a deep breath and pain to move side to side. Denies known injury.

## 2019-08-09 NOTE — ED Provider Notes (Signed)
[name]    CSN: [id number] Arrival date & time: 08/09/19  1421      History   Chief Complaint Chief Complaint  Patient presents with  . Chest Pain    HPI Clinton Evans is a 34 y.o. male.   34 year old male comes in for 2 day history of left sided chest pain. Denies injury/trauma. Pain is constant, worse with movement/palpation/deep breathing. Denies shortness of breath, URI symptoms, fever. Work requires heavy lifting.      Past Medical History:  Diagnosis Date  . Medical history non-contributory     Patient Active Problem List   Diagnosis Date Noted  . Vitamin D deficiency 05/01/2016  . Depression 04/29/2016  . Chronic back pain 04/29/2016  . Current smoker 04/29/2016  . Essential hypertension 03/06/2015  . Narcotic abuse, continuous ([place]) 01/30/2015  . Adult ADHD 03/06/2014    Past Surgical History:  Procedure Laterality Date  . BACK SURGERY  march 2016, aug 2016  . HERNIA REPAIR     3 hernia surgeries, 1 as a baby and 2 more.  Marland Kitchen RADIAL HEAD ARTHROPLASTY Right 03/15/2013   Procedure: RIGHT RADIAL HEAD ARTHROPLASTY;  Surgeon: Rozanna Box, MD;  Location: [facility];  Service: Orthopedics;  Laterality: Right;  . TESTICLE SURGERY Right    testicle removed as a child      Home Medications    Prior to Admission medications   Medication Sig Start Date End Date Taking? Authorizing Provider  diclofenac (VOLTAREN) 50 MG EC tablet Take 1 tablet (50 mg total) by mouth 2 (two) times daily. 08/09/19   Tasia Catchings, Andras Grunewald V, PA-C  methocarbamol (ROBAXIN) 500 MG tablet Take 1 tablet (500 mg total) by mouth 2 (two) times daily. 08/09/19   Ok Edwards, PA-C    Family History Family History  Problem Relation Age of Onset  . Diabetes type II Father     Social History Social History   Tobacco Use  . Smoking status: Current Every Day Smoker    Packs/day: 0.50  . Smokeless tobacco: Never Used  Substance Use Topics  . Alcohol use: Yes    Comment: occ  . Drug use: No      Allergies   Bactrim [sulfamethoxazole-trimethoprim]   Review of Systems Review of Systems  Reason unable to perform ROS: See HPI as above.     Physical Exam Triage Vital Signs ED Triage Vitals [08/09/19 1442]  Enc Vitals Group     BP (!) 148/92     Pulse Rate 67     Resp 18     Temp 98 F (36.7 C)     Temp Source Oral     SpO2 96 %     Weight      Height      Head Circumference      Peak Flow      Pain Score 3     Pain Loc      Pain Edu?      Excl. in [place]?    No data found.  Updated Vital Signs BP (!) 148/92 (BP Location: Left Arm)   Pulse 67   Temp 98 F (36.7 C) (Oral)   Resp 18   SpO2 96%   Physical Exam Constitutional:      General: He is not in acute distress.    Appearance: Normal appearance. He is well-developed. He is not toxic-appearing or diaphoretic.  HENT:     Head: Normocephalic and atraumatic.  Eyes:  Conjunctiva/sclera: Conjunctivae normal.     Pupils: Pupils are equal, round, and reactive to light.  Cardiovascular:     Rate and Rhythm: Normal rate and regular rhythm.  Pulmonary:     Effort: Pulmonary effort is normal. No respiratory distress.     Comments: Speaking in full sentences without difficulty. LCTAB Chest:     Comments: Diffuse tenderness to palpation of left chest Musculoskeletal:     Cervical back: Normal range of motion and neck supple.  Skin:    General: Skin is warm and dry.  Neurological:     Mental Status: He is alert and oriented to person, place, and time.     UC Treatments / Results  Labs (all labs ordered are listed, but only abnormal results are displayed) Labs Reviewed - No data to display  EKG   Radiology No results found.  Procedures Procedures (including critical care time)  Medications Ordered in UC Medications - No data to display  Initial Impression / Assessment and Plan / UC Course  I have reviewed the triage vital signs and the nursing notes.  Pertinent labs & imaging results  that were available during my care of the patient were reviewed by me and considered in my medical decision making (see chart for details).    Left chest wall pain that is reproducible on exam.  Lungs clear to auscultation bilaterally without adventitious lung sounds, lower suspicion for spontaneous pneumothorax.  Stable vitals, low suspicion for PE, ACS.  Will provide for symptomatic management with NSAIDs, muscle relaxers.  Return precautions given.  Final Clinical Impressions(s) / UC Diagnoses   Final diagnoses:  Left-sided chest wall pain    ED Prescriptions    Medication Sig Dispense Auth. Provider   diclofenac (VOLTAREN) 50 MG EC tablet Take 1 tablet (50 mg total) by mouth 2 (two) times daily. 20 tablet Dalphine Cowie V, PA-C   methocarbamol (ROBAXIN) 500 MG tablet Take 1 tablet (500 mg total) by mouth 2 (two) times daily. 20 tablet Belinda Fisher, PA-C     PDMP not reviewed this encounter.   Belinda Fisher, PA-C 08/09/19 [id number]

## 2019-09-28 ENCOUNTER — Ambulatory Visit: Payer: BC Managed Care – PPO | Attending: Internal Medicine

## 2019-09-28 DIAGNOSIS — Z20822 Contact with and (suspected) exposure to covid-19: Secondary | ICD-10-CM

## 2019-09-29 LAB — SARS-COV-2, NAA 2 DAY TAT

## 2019-09-29 LAB — NOVEL CORONAVIRUS, NAA: SARS-CoV-2, NAA: NOT DETECTED

## 2021-09-13 ENCOUNTER — Ambulatory Visit (INDEPENDENT_AMBULATORY_CARE_PROVIDER_SITE_OTHER): Payer: BC Managed Care – PPO

## 2021-09-13 ENCOUNTER — Ambulatory Visit
Admission: EM | Admit: 2021-09-13 | Discharge: 2021-09-13 | Disposition: A | Discharge status: Home / Self Care | Payer: BC Managed Care – PPO | Attending: Student | Admitting: Student

## 2021-09-13 DIAGNOSIS — R030 Elevated blood-pressure reading, without diagnosis of hypertension: Secondary | ICD-10-CM | POA: Diagnosis not present

## 2021-09-13 DIAGNOSIS — R052 Subacute cough: Secondary | ICD-10-CM | POA: Diagnosis not present

## 2021-09-13 DIAGNOSIS — R0602 Shortness of breath: Secondary | ICD-10-CM | POA: Diagnosis not present

## 2021-09-13 DIAGNOSIS — F17210 Nicotine dependence, cigarettes, uncomplicated: Secondary | ICD-10-CM

## 2021-09-13 MED ORDER — AMOXICILLIN-POT CLAVULANATE 875-125 MG PO TABS: 1.0000 | ORAL_TABLET | Freq: Two times a day (BID) | ORAL | 0 refills | Status: DC

## 2021-09-13 MED ORDER — ALBUTEROL SULFATE HFA 108 (90 BASE) MCG/ACT IN AERS: 1.0000 | INHALATION_SPRAY | Freq: Four times a day (QID) | RESPIRATORY_TRACT | 0 refills | Status: DC | PRN

## 2021-09-13 NOTE — ED Triage Notes (Signed)
Pt c/o acute illness ~2-3 weeks ago. States he took off of work for about 2 weeks and returned to work Monday. States since going back to work he has noticed he is lightheaded, gets SOB easily, and fatigued.

## 2021-09-13 NOTE — Discharge Instructions (Addendum)
-  Your xray looked normal, but we're still going to cover for bacteria with an antibiotic  -Start the antibiotic-Augmentin (amoxicillin-clavulanate), 1 pill every 12 hours for 7 days.  You can take this with food like with breakfast and dinner. -Albuterol inhaler as needed for cough, wheezing, shortness of breath, 1 to 2 puffs every 6 hours as needed. -Follow-up with PCP as scheduled 10/2021  -Please check your blood pressure at home or at the pharmacy. If this continues to be >140/90, follow-up with your primary care provider for further blood pressure management/ medication titration. If you develop chest pain, shortness of breath, vision changes, the worst headache of your life- head straight to the ED or call 911.

## 2021-09-13 NOTE — ED Provider Notes (Signed)
[name]    CSN: [id number] Arrival date & time: 09/13/21  7829      History   Chief Complaint Chief Complaint  Patient presents with   Fatigue    HPI Clinton Evans is a 36 y.o. male presenting with cough for about 3 weeks.  He is a current cigarette smoker, history otherwise noncontributory.  States he had a viral syndrome about 3 weeks ago, this seemed to improve but then got worse again over the last 3 days.  Now endorses dyspnea on exertion, cough productive of sputum (unsure color), malaise, fatigue.  Has not attempted medications for the symptoms.  Denies current shortness of breath, chest pain, dizziness, weakness, fevers.  Denies pedal edema, prolonged immobilization.  HPI  Past Medical History:  Diagnosis Date   Medical history non-contributory     Patient Active Problem List   Diagnosis Date Noted   Vitamin D deficiency 05/01/2016   Depression 04/29/2016   Chronic back pain 04/29/2016   Current smoker 04/29/2016   Essential hypertension 03/06/2015   Narcotic abuse, continuous ([facility]) 01/30/2015   Adult ADHD 03/06/2014    Past Surgical History:  Procedure Laterality Date   BACK SURGERY  march 2016, aug 2016   HERNIA REPAIR     3 hernia surgeries, 1 as a baby and 2 more.   RADIAL HEAD ARTHROPLASTY Right 03/15/2013   Procedure: RIGHT RADIAL HEAD ARTHROPLASTY;  Surgeon: Budd Palmer, MD;  Location: [facility] OR;  Service: Orthopedics;  Laterality: Right;   TESTICLE SURGERY Right    testicle removed as a child       Home Medications    Prior to Admission medications   Medication Sig Start Date End Date Taking? Authorizing Provider  albuterol (VENTOLIN HFA) 108 (90 Base) MCG/ACT inhaler Inhale 1-2 puffs into the lungs every 6 (six) hours as needed for wheezing or shortness of breath. 09/13/21  Yes Rhys Martini, PA-C  amoxicillin-clavulanate (AUGMENTIN) 875-125 MG tablet Take 1 tablet by mouth every 12 (twelve) hours. 09/13/21  Yes Rhys Martini,  PA-C    Family History Family History  Problem Relation Age of Onset   Diabetes type II Father     Social History Social History   Tobacco Use   Smoking status: Every Day    Packs/day: 0.50    Types: Cigarettes   Smokeless tobacco: Never  Substance Use Topics   Alcohol use: Yes    Comment: occ   Drug use: No     Allergies   Bactrim [sulfamethoxazole-trimethoprim]   Review of Systems Review of Systems  Constitutional:  Negative for appetite change, chills and fever.  HENT:  Positive for congestion. Negative for ear pain, rhinorrhea, sinus pressure, sinus pain and sore throat.   Eyes:  Negative for redness and visual disturbance.  Respiratory:  Positive for cough and shortness of breath. Negative for chest tightness and wheezing.   Cardiovascular:  Negative for chest pain and palpitations.  Gastrointestinal:  Negative for abdominal pain, constipation, diarrhea, nausea and vomiting.  Genitourinary:  Negative for dysuria, frequency and urgency.  Musculoskeletal:  Negative for myalgias.  Neurological:  Negative for dizziness, weakness and headaches.  Psychiatric/Behavioral:  Negative for confusion.   All other systems reviewed and are negative.    Physical Exam Triage Vital Signs ED Triage Vitals  Enc Vitals Group     BP 09/13/21 1007 (!) 147/98     Pulse Rate 09/13/21 1007 75     Resp 09/13/21 1007 18  Temp 09/13/21 1007 98 F (36.7 C)     Temp Source 09/13/21 1007 Oral     SpO2 09/13/21 1007 98 %     Weight --      Height --      Head Circumference --      Peak Flow --      Pain Score 09/13/21 1008 0     Pain Loc --      Pain Edu? --      Excl. in [place]? --    No data found.  Updated Vital Signs BP (!) 147/98 (BP Location: Left Arm)   Pulse 75   Temp 98 F (36.7 C) (Oral)   Resp 18   SpO2 98%   Visual Acuity Right Eye Distance:   Left Eye Distance:   Bilateral Distance:    Right Eye Near:   Left Eye Near:    Bilateral Near:     Physical  Exam Vitals reviewed.  Constitutional:      General: He is not in acute distress.    Appearance: Normal appearance. He is not ill-appearing.  HENT:     Head: Normocephalic and atraumatic.     Right Ear: Tympanic membrane, ear canal and external ear normal. No tenderness. No middle ear effusion. There is no impacted cerumen. Tympanic membrane is not perforated, erythematous, retracted or bulging.     Left Ear: Tympanic membrane, ear canal and external ear normal. No tenderness.  No middle ear effusion. There is no impacted cerumen. Tympanic membrane is not perforated, erythematous, retracted or bulging.     Nose: Nose normal. No congestion.     Mouth/Throat:     Mouth: Mucous membranes are moist.     Pharynx: Uvula midline. No oropharyngeal exudate or posterior oropharyngeal erythema.  Eyes:     Extraocular Movements: Extraocular movements intact.     Pupils: Pupils are equal, round, and reactive to light.  Cardiovascular:     Rate and Rhythm: Normal rate and regular rhythm.     Heart sounds: Normal heart sounds.  Pulmonary:     Effort: Pulmonary effort is normal.     Breath sounds: Normal breath sounds. No decreased breath sounds, wheezing, rhonchi or rales.     Comments: Occ cough  Abdominal:     Palpations: Abdomen is soft.     Tenderness: There is no abdominal tenderness. There is no guarding or rebound.  Lymphadenopathy:     Cervical: No cervical adenopathy.     Right cervical: No superficial cervical adenopathy.    Left cervical: No superficial cervical adenopathy.  Neurological:     General: No focal deficit present.     Mental Status: He is alert and oriented to person, place, and time.  Psychiatric:        Mood and Affect: Mood normal.        Behavior: Behavior normal.        Thought Content: Thought content normal.        Judgment: Judgment normal.      UC Treatments / Results  Labs (all labs ordered are listed, but only abnormal results are displayed) Labs  Reviewed - No data to display  EKG   Radiology DG Chest 2 View  Result Date: 09/13/2021 CLINICAL DATA:  Provided history: Shortness of breath for 3 weeks following viral syndrome. Smoker. EXAM: CHEST - 2 VIEW COMPARISON:  Chest CT 05/25/2016. Report from chest radiograph 09/04/2015 (image currently unavailable). FINDINGS: Heart size within normal limits. No appreciable  airspace consolidation. Shirt buttons overlie the mid lung fields, bilaterally. No evidence of pleural effusion or pneumothorax. No acute bony abnormality identified. IMPRESSION: No evidence of acute cardiopulmonary abnormality. Electronically Signed   By: Jackey Loge D.O.   On: 09/13/2021 10:39    Procedures Procedures (including critical care time)  Medications Ordered in UC Medications - No data to display  Initial Impression / Assessment and Plan / UC Course  I have reviewed the triage vital signs and the nursing notes.  Pertinent labs & imaging results that were available during my care of the patient were reviewed by me and considered in my medical decision making (see chart for details).     This patient is a very pleasant 36 y.o. year old male presenting with cough x3 weeks following viral syndrome. Afebrile, nontachy. No adventitious breath sounds, nontoxic appearing.  CXR - no evidence of acute cardiopulmonary abnormality.  Given current cigarette smoker with cough x3 weeks that is progressively worsening, will cover with augmentin and albuterol inhaler.   ED return precautions discussed. Patient verbalizes understanding and agreement.   Final Clinical Impressions(s) / UC Diagnoses   Final diagnoses:  Subacute cough  Cigarette smoker  Elevated blood pressure reading in office without diagnosis of hypertension     Discharge Instructions      -Your xray looked normal, but we're still going to cover for bacteria with an antibiotic  -Start the antibiotic-Augmentin (amoxicillin-clavulanate), 1 pill  every 12 hours for 7 days.  You can take this with food like with breakfast and dinner. -Albuterol inhaler as needed for cough, wheezing, shortness of breath, 1 to 2 puffs every 6 hours as needed. -Follow-up with PCP as scheduled 10/2021  -Please check your blood pressure at home or at the pharmacy. If this continues to be >140/90, follow-up with your primary care provider for further blood pressure management/ medication titration. If you develop chest pain, shortness of breath, vision changes, the worst headache of your life- head straight to the ED or call 911.      ED Prescriptions     Medication Sig Dispense Auth. Provider   amoxicillin-clavulanate (AUGMENTIN) 875-125 MG tablet Take 1 tablet by mouth every 12 (twelve) hours. 14 tablet Rhys Martini, PA-C   albuterol (VENTOLIN HFA) 108 (90 Base) MCG/ACT inhaler Inhale 1-2 puffs into the lungs every 6 (six) hours as needed for wheezing or shortness of breath. 1 each Rhys Martini, PA-C      PDMP not reviewed this encounter.   Rhys Martini, PA-C 09/13/21 1234

## 2021-10-03 ENCOUNTER — Ambulatory Visit: Payer: BC Managed Care – PPO | Admitting: Family Medicine

## 2021-10-03 ENCOUNTER — Encounter: Payer: Self-pay | Admitting: Family Medicine

## 2021-10-03 VITALS — BP 159/75 | HR 81 | Temp 98.3°F | Ht 73.0 in | Wt 192.4 lb

## 2021-10-03 DIAGNOSIS — I10 Essential (primary) hypertension: Secondary | ICD-10-CM | POA: Diagnosis not present

## 2021-10-03 DIAGNOSIS — F419 Anxiety disorder, unspecified: Secondary | ICD-10-CM

## 2021-10-03 DIAGNOSIS — R82998 Other abnormal findings in urine: Secondary | ICD-10-CM | POA: Diagnosis not present

## 2021-10-03 DIAGNOSIS — F321 Major depressive disorder, single episode, moderate: Secondary | ICD-10-CM | POA: Diagnosis not present

## 2021-10-03 DIAGNOSIS — R5383 Other fatigue: Secondary | ICD-10-CM | POA: Diagnosis not present

## 2021-10-03 DIAGNOSIS — R6889 Other general symptoms and signs: Secondary | ICD-10-CM | POA: Diagnosis not present

## 2021-10-03 LAB — MICROSCOPIC EXAMINATION
Bacteria, UA: NONE SEEN
Epithelial Cells (non renal): NONE SEEN /hpf (ref 0–10)
RBC, Urine: NONE SEEN /hpf (ref 0–2)
Renal Epithel, UA: NONE SEEN /hpf
WBC, UA: NONE SEEN /hpf (ref 0–5)

## 2021-10-03 LAB — URINALYSIS, ROUTINE W REFLEX MICROSCOPIC
Bilirubin, UA: NEGATIVE
Glucose, UA: NEGATIVE
Leukocytes,UA: NEGATIVE
Nitrite, UA: NEGATIVE
RBC, UA: NEGATIVE
Specific Gravity, UA: 1.02 (ref 1.005–1.030)
Urobilinogen, Ur: 1 mg/dL (ref 0.2–1.0)
pH, UA: 6.5 (ref 5.0–7.5)

## 2021-10-03 MED ORDER — AMLODIPINE BESYLATE 5 MG PO TABS: 5.0000 mg | ORAL_TABLET | Freq: Every day | ORAL | 1 refills | Status: DC

## 2021-10-03 MED ORDER — DESVENLAFAXINE SUCCINATE ER 50 MG PO TB24: 50.0000 mg | ORAL_TABLET | Freq: Every day | ORAL | 1 refills | Status: DC

## 2021-10-03 NOTE — Progress Notes (Signed)
 New Patient Office Visit  Subjective    Patient ID: Clinton Evans, male    DOB: 06/04/1985  Age: 36 y.o. MRN: [id number]  CC:  Chief Complaint  Patient presents with   New Patient (Initial Visit)   Hypertension    HPI Clinton Evans presents to establish care. He is concerned about elevated BPs for the last 3 months. He has checked it a few times at a pharmacy and an UC each time it was 140s-150s/90-100. He denies chest pain, focal weakness, dizziness, edema, orthopnea. He does sometimes have some mild shortness of breath with exertion and palpitations with anxiety.   He reports depression symptoms and increased anxiety for about 1 year. He had a divorce last year and this is when things worsened. He is short tempered and irritable. He did see psychiatry for a short time last year. They tried lithium but did not diagnosis him with bipolar disorder. He did not do well with lithium.   He reports dark urine for a few months. He drinks a gallon of water a day. He also drinks several red bulls, probably up to 800 mg a day. He works in a hot environment as well and he sweats a lot. He drinks 6-7 beers at night as well as well. Denies myalgias or weakness. Denies hematuria, dysuria, swelling, or discharge.        10/03/2021   10:11 AM 04/29/2016    8:22 AM 09/06/2015    8:36 AM  Depression screen PHQ 2/9  Decreased Interest 3 0 0  Down, Depressed, Hopeless 1 0 0  PHQ - 2 Score 4 0 0  Altered sleeping 1    Tired, decreased energy 2    Change in appetite 1    Feeling bad or failure about yourself  1    Trouble concentrating 0    Moving slowly or fidgety/restless 0    Suicidal thoughts 0    PHQ-9 Score 9    Difficult doing work/chores Very difficult        10/03/2021   10:12 AM  GAD 7 : Generalized Anxiety Score  Nervous, Anxious, on Edge 1  Control/stop worrying 1  Worry too much - different things 1  Trouble relaxing 2  Restless 2  Easily annoyed or irritable 3  Afraid - awful  might happen 0  Total GAD 7 Score 10  Anxiety Difficulty Very difficult      Outpatient Encounter Medications as of 10/03/2021  Medication Sig   [DISCONTINUED] albuterol (VENTOLIN HFA) 108 (90 Base) MCG/ACT inhaler Inhale 1-2 puffs into the lungs every 6 (six) hours as needed for wheezing or shortness of breath.   [DISCONTINUED] amoxicillin-clavulanate (AUGMENTIN) 875-125 MG tablet Take 1 tablet by mouth every 12 (twelve) hours.   No facility-administered encounter medications on file as of 10/03/2021.    Past Medical History:  Diagnosis Date   ADHD    Medical history non-contributory     Past Surgical History:  Procedure Laterality Date   BACK SURGERY  march 2016, aug 2016   HERNIA REPAIR     3 hernia surgeries, 1 as a baby and 2 more.   RADIAL HEAD ARTHROPLASTY Right 03/15/2013   Procedure: RIGHT RADIAL HEAD ARTHROPLASTY;  Surgeon: Michael H Handy, MD;  Location: [facility] OR;  Service: Orthopedics;  Laterality: Right;   TESTICLE SURGERY Right    testicle removed as a child    Family History  Problem Relation Age of Onset   Diabetes Father      Alcohol abuse Father    Diabetes type II Father    ADD / ADHD Daughter    Stroke Paternal Grandfather     Social History   Socioeconomic History   Marital status: Divorced    Spouse name: Not on file   Number of children: 3   Years of education: 12   Highest education level: High school graduate  Occupational History   Not on file  Tobacco Use   Smoking status: Former    Packs/day: 1.00    Years: 10.00    Total pack years: 10.00    Types: Cigarettes    Quit date: 2020    Years since quitting: 3.5   Smokeless tobacco: Never  Vaping Use   Vaping Use: Never used  Substance and Sexual Activity   Alcohol use: Yes    Alcohol/week: 40.0 standard drinks of alcohol    Types: 40 Cans of beer per week    Comment: occ   Drug use: Yes    Types: Marijuana    Comment: 1-2 times monthly   Sexual activity: Yes  Other Topics Concern    Not on file  Social History Narrative   Not on file   Social Determinants of Health   Financial Resource Strain: Not on file  Food Insecurity: Not on file  Transportation Needs: Not on file  Physical Activity: Not on file  Stress: Not on file  Social Connections: Not on file  Intimate Partner Violence: Not on file    ROS As per HPI.      Objective    BP (!) 159/75   Pulse 81   Temp 98.3 F (36.8 C) (Temporal)   Ht 6' 1" (1.854 m)   Wt 192 lb 6 oz (87.3 kg)   BMI 25.38 kg/m   Physical Exam Vitals and nursing note reviewed.  Constitutional:      General: He is not in acute distress.    Appearance: He is not ill-appearing, toxic-appearing or diaphoretic.  HENT:     Head: Normocephalic and atraumatic.     Right Ear: Tympanic membrane, ear canal and external ear normal.     Left Ear: Tympanic membrane, ear canal and external ear normal.     Nose: Nose normal.     Mouth/Throat:     Mouth: Mucous membranes are moist.     Pharynx: Oropharynx is clear.  Eyes:     Extraocular Movements: Extraocular movements intact.     Pupils: Pupils are equal, round, and reactive to light.  Neck:     Thyroid: No thyroid mass, thyromegaly or thyroid tenderness.  Cardiovascular:     Rate and Rhythm: Normal rate and regular rhythm.     Heart sounds: Normal heart sounds. No murmur heard. Pulmonary:     Effort: Pulmonary effort is normal. No respiratory distress.     Breath sounds: Normal breath sounds. No wheezing, rhonchi or rales.  Chest:     Chest wall: No tenderness.  Abdominal:     General: Bowel sounds are normal. There is no distension.     Palpations: Abdomen is soft.     Tenderness: There is no abdominal tenderness. There is no guarding or rebound.  Musculoskeletal:     Right lower leg: No edema.     Left lower leg: No edema.  Skin:    General: Skin is warm and dry.  Neurological:     General: No focal deficit present.     Mental Status: He is alert  and oriented to  person, place, and time.     Motor: No weakness.     Gait: Gait normal.  Psychiatric:        Mood and Affect: Mood normal.        Behavior: Behavior normal.         Assessment & Plan:   Aaran was seen today for new patient (initial visit) and hypertension.  Diagnoses and all orders for this visit:  Primary hypertension Elevated BP today. Asymptomatic. Start amlodipine today. Follow up in 4 weeks, sooner for new or worsening symptoms.  -     amLODipine (NORVASC) 5 MG tablet; Take 1 tablet (5 mg total) by mouth daily. -     CMP14+EGFR -     Thyroid Panel With TSH -     Anemia Profile B  Dark urine UA with 1+ protein, 1+ bili, trace ketones. Negative micro. Will check labs and CK today. Discussed increased hydration, decrease caffeine and alcohol intake.  -     Urinalysis, Routine w reflex microscopic -     CK  Depression, major, single episode, moderate ([facility]) Anxiety Uncontrolled. Denies SI. Start pristiq daily.  -     desvenlafaxine (PRISTIQ) 50 MG 24 hr tablet; Take 1 tablet (50 mg total) by mouth daily.  Fatigue, unspecified type Will check labs as below. Likely due to uncontrolled depression.  -     Anemia Profile B  Return in about 4 weeks (around 10/31/2021) for CPE and fasting labs.  The patient indicates understanding of these issues and agrees with the plan.   Gwenlyn Perking, FNP

## 2021-10-03 NOTE — Patient Instructions (Signed)
Hypertension, Adult High blood pressure (hypertension) is when the force of blood pumping through the arteries is too strong. The arteries are the blood vessels that carry blood from the heart throughout the body. Hypertension forces the heart to work harder to pump blood and may cause arteries to become narrow or stiff. Untreated or uncontrolled hypertension can lead to a heart attack, heart failure, a stroke, kidney disease, and other problems. A blood pressure reading consists of a higher number over a lower number. Ideally, your blood pressure should be below 120/80. The first ("top") number is called the systolic pressure. It is a measure of the pressure in your arteries as your heart beats. The second ("bottom") number is called the diastolic pressure. It is a measure of the pressure in your arteries as the heart relaxes. What are the causes? The exact cause of this condition is not known. There are some conditions that result in high blood pressure. What increases the risk? Certain factors may make you more likely to develop high blood pressure. Some of these risk factors are under your control, including: Smoking. Not getting enough exercise or physical activity. Being overweight. Having too much fat, sugar, calories, or salt (sodium) in your diet. Drinking too much alcohol. Other risk factors include: Having a personal history of heart disease, diabetes, high cholesterol, or kidney disease. Stress. Having a family history of high blood pressure and high cholesterol. Having obstructive sleep apnea. Age. The risk increases with age. What are the signs or symptoms? High blood pressure may not cause symptoms. Very high blood pressure (hypertensive crisis) may cause: Headache. Fast or irregular heartbeats (palpitations). Shortness of breath. Nosebleed. Nausea and vomiting. Vision changes. Severe chest pain, dizziness, and seizures. How is this diagnosed? This condition is diagnosed by  measuring your blood pressure while you are seated, with your arm resting on a flat surface, your legs uncrossed, and your feet flat on the floor. The cuff of the blood pressure monitor will be placed directly against the skin of your upper arm at the level of your heart. Blood pressure should be measured at least twice using the same arm. Certain conditions can cause a difference in blood pressure between your right and left arms. If you have a high blood pressure reading during one visit or you have normal blood pressure with other risk factors, you may be asked to: Return on a different day to have your blood pressure checked again. Monitor your blood pressure at home for 1 week or longer. If you are diagnosed with hypertension, you may have other blood or imaging tests to help your health care provider understand your overall risk for other conditions. How is this treated? This condition is treated by making healthy lifestyle changes, such as eating healthy foods, exercising more, and reducing your alcohol intake. You may be referred for counseling on a healthy diet and physical activity. Your health care provider may prescribe medicine if lifestyle changes are not enough to get your blood pressure under control and if: Your systolic blood pressure is above 130. Your diastolic blood pressure is above 80. Your personal target blood pressure may vary depending on your medical conditions, your age, and other factors. Follow these instructions at home: Eating and drinking  Eat a diet that is high in fiber and potassium, and low in sodium, added sugar, and fat. An example of this eating plan is called the DASH diet. DASH stands for Dietary Approaches to Stop Hypertension. To eat this way: Eat   plenty of fresh fruits and vegetables. Try to fill one half of your plate at each meal with fruits and vegetables. Eat whole grains, such as whole-wheat pasta, brown rice, or whole-grain bread. Fill about one  fourth of your plate with whole grains. Eat or drink low-fat dairy products, such as skim milk or low-fat yogurt. Avoid fatty cuts of meat, processed or cured meats, and poultry with skin. Fill about one fourth of your plate with lean proteins, such as fish, chicken without skin, beans, eggs, or tofu. Avoid pre-made and processed foods. These tend to be higher in sodium, added sugar, and fat. Reduce your daily sodium intake. Many people with hypertension should eat less than 1,500 mg of sodium a day. Do not drink alcohol if: Your health care provider tells you not to drink. You are pregnant, may be pregnant, or are planning to become pregnant. If you drink alcohol: Limit how much you have to: 0-1 drink a day for women. 0-2 drinks a day for men. Know how much alcohol is in your drink. In the [place] one drink equals one 12 oz bottle of beer (355 mL), one 5 oz glass of wine (148 mL), or one 1 oz glass of hard liquor (44 mL). Lifestyle  Work with your health care provider to maintain a healthy body weight or to lose weight. Ask what an ideal weight is for you. Get at least 30 minutes of exercise that causes your heart to beat faster (aerobic exercise) most days of the week. Activities may include walking, swimming, or biking. Include exercise to strengthen your muscles (resistance exercise), such as Pilates or lifting weights, as part of your weekly exercise routine. Try to do these types of exercises for 30 minutes at least 3 days a week. Do not use any products that contain nicotine or tobacco. These products include cigarettes, chewing tobacco, and vaping devices, such as e-cigarettes. If you need help quitting, ask your health care provider. Monitor your blood pressure at home as told by your health care provider. Keep all follow-up visits. This is important. Medicines Take over-the-counter and prescription medicines only as told by your health care provider. Follow directions carefully. Blood  pressure medicines must be taken as prescribed. Do not skip doses of blood pressure medicine. Doing this puts you at risk for problems and can make the medicine less effective. Ask your health care provider about side effects or reactions to medicines that you should watch for. Contact a health care provider if you: Think you are having a reaction to a medicine you are taking. Have headaches that keep coming back (recurring). Feel dizzy. Have swelling in your ankles. Have trouble with your vision. Get help right away if you: Develop a severe headache or confusion. Have unusual weakness or numbness. Feel faint. Have severe pain in your chest or abdomen. Vomit repeatedly. Have trouble breathing. These symptoms may be an emergency. Get help right away. Call 911. Do not wait to see if the symptoms will go away. Do not drive yourself to the hospital. Summary Hypertension is when the force of blood pumping through your arteries is too strong. If this condition is not controlled, it may put you at risk for serious complications. Your personal target blood pressure may vary depending on your medical conditions, your age, and other factors. For most people, a normal blood pressure is less than 120/80. Hypertension is treated with lifestyle changes, medicines, or a combination of both. Lifestyle changes include losing weight, eating a healthy,   low-sodium diet, exercising more, and limiting alcohol. This information is not intended to replace advice given to you by your health care provider. Make sure you discuss any questions you have with your health care provider. Document Revised: 12/25/2020 Document Reviewed: 12/25/2020 Elsevier Patient Education  2023 [employer]  

## 2021-10-04 LAB — ANEMIA PROFILE B
Basophils Absolute: 0.1 10*3/uL (ref 0.0–0.2)
Basos: 1 %
EOS (ABSOLUTE): 0.1 10*3/uL (ref 0.0–0.4)
Eos: 1 %
Ferritin: 283 ng/mL (ref 30–400)
Folate: 8.1 ng/mL (ref >3.0)
Hematocrit: 44.1 % (ref 37.5–51.0)
Hemoglobin: 15.4 g/dL (ref 13.0–17.7)
Immature Grans (Abs): 0 10*3/uL (ref 0.0–0.1)
Immature Granulocytes: 0 %
Iron Saturation: 25 % (ref 15–55)
Iron: 81 ug/dL (ref 38–169)
Lymphocytes Absolute: 3.1 10*3/uL (ref 0.7–3.1)
Lymphs: 24 %
MCH: 31.4 pg (ref 26.6–33.0)
MCHC: 34.9 g/dL (ref 31.5–35.7)
MCV: 90 fL (ref 79–97)
Monocytes Absolute: 0.9 10*3/uL (ref 0.1–0.9)
Monocytes: 7 %
Neutrophils Absolute: 8.9 10*3/uL — ABNORMAL HIGH (ref 1.4–7.0)
Neutrophils: 67 %
Platelets: 268 10*3/uL (ref 150–450)
RBC: 4.9 x10E6/uL (ref 4.14–5.80)
RDW: 11.9 % (ref 11.6–15.4)
Retic Ct Pct: 1.2 % (ref 0.6–2.6)
Total Iron Binding Capacity: 326 ug/dL (ref 250–450)
UIBC: 245 ug/dL (ref 111–343)
Vitamin B-12: 583 pg/mL (ref 232–1245)
WBC: 13 10*3/uL — ABNORMAL HIGH (ref 3.4–10.8)

## 2021-10-04 LAB — THYROID PANEL WITH TSH
Free Thyroxine Index: 1.8 (ref 1.2–4.9)
T3 Uptake Ratio: 26 % (ref 24–39)
T4, Total: 7.1 ug/dL (ref 4.5–12.0)
TSH: 2.6 u[IU]/mL (ref 0.450–4.500)

## 2021-10-04 LAB — CMP14+EGFR
ALT: 16 IU/L (ref 0–44)
AST: 25 IU/L (ref 0–40)
Albumin/Globulin Ratio: 1.9 (ref 1.2–2.2)
Albumin: 4.4 g/dL (ref 4.1–5.1)
Alkaline Phosphatase: 65 IU/L (ref 44–121)
BUN/Creatinine Ratio: 8 — ABNORMAL LOW (ref 9–20)
BUN: 8 mg/dL (ref 6–20)
Bilirubin Total: 0.6 mg/dL (ref 0.0–1.2)
CO2: 25 mmol/L (ref 20–29)
Calcium: 9.2 mg/dL (ref 8.7–10.2)
Chloride: 101 mmol/L (ref 96–106)
Creatinine, Ser: 1.06 mg/dL (ref 0.76–1.27)
Globulin, Total: 2.3 g/dL (ref 1.5–4.5)
Glucose: 84 mg/dL (ref 70–99)
Potassium: 4.5 mmol/L (ref 3.5–5.2)
Sodium: 142 mmol/L (ref 134–144)
Total Protein: 6.7 g/dL (ref 6.0–8.5)
eGFR: 94 mL/min/{1.73_m2} (ref >59)

## 2021-10-04 LAB — CK: Total CK: 241 U/L (ref 49–439)

## 2021-11-06 ENCOUNTER — Encounter: Payer: Self-pay | Admitting: Family Medicine

## 2021-11-06 ENCOUNTER — Ambulatory Visit (INDEPENDENT_AMBULATORY_CARE_PROVIDER_SITE_OTHER): Payer: BC Managed Care – PPO | Admitting: Family Medicine

## 2021-11-06 VITALS — BP 147/71 | HR 74 | Temp 98.5°F | Ht 73.0 in | Wt 192.0 lb

## 2021-11-06 DIAGNOSIS — I10 Essential (primary) hypertension: Secondary | ICD-10-CM | POA: Diagnosis not present

## 2021-11-06 DIAGNOSIS — Z0000 Encounter for general adult medical examination without abnormal findings: Secondary | ICD-10-CM

## 2021-11-06 DIAGNOSIS — F419 Anxiety disorder, unspecified: Secondary | ICD-10-CM | POA: Diagnosis not present

## 2021-11-06 DIAGNOSIS — F321 Major depressive disorder, single episode, moderate: Secondary | ICD-10-CM | POA: Diagnosis not present

## 2021-11-06 DIAGNOSIS — Z136 Encounter for screening for cardiovascular disorders: Secondary | ICD-10-CM | POA: Diagnosis not present

## 2021-11-06 DIAGNOSIS — Z0001 Encounter for general adult medical examination with abnormal findings: Secondary | ICD-10-CM | POA: Diagnosis not present

## 2021-11-06 DIAGNOSIS — F331 Major depressive disorder, recurrent, moderate: Secondary | ICD-10-CM | POA: Insufficient documentation

## 2021-11-06 DIAGNOSIS — F410 Panic disorder [episodic paroxysmal anxiety] without agoraphobia: Secondary | ICD-10-CM | POA: Insufficient documentation

## 2021-11-06 MED ORDER — FLUOXETINE HCL 10 MG PO CAPS: 10.0000 mg | ORAL_CAPSULE | Freq: Every day | ORAL | 3 refills | Status: DC

## 2021-11-06 NOTE — Progress Notes (Signed)
Complete physical exam  Patient: Clinton Evans   DOB: 1985-08-10   36 y.o. Male  MRN: [id number]  Subjective:    Chief Complaint  Patient presents with   Annual Exam    Clinton Evans is a 36 y.o. male who presents today for a complete physical exam. He reports consuming a general diet. The patient has a physically strenuous job, but has no regular exercise apart from work.  He generally feels fairly well. He reports sleeping poorly due to work schedule. He does have additional problems to discuss today.   He has been checking his BP at home but did not bring a log with him today. He purchased a new machine but is concerned about the accuracy of this. He reports readings around 267T systolic.  He started pristiq but stopped taking it after a few days due to fatigue. He is interested in trying something else.       11/06/2021    3:47 PM 10/03/2021   10:11 AM 04/29/2016    8:22 AM  Depression screen PHQ 2/9  Decreased Interest 3 3 0  Down, Depressed, Hopeless 2 1 0  PHQ - 2 Score 5 4 0  Altered sleeping 2 1   Tired, decreased energy 1 2   Change in appetite 0 1   Feeling bad or failure about yourself  0 1   Trouble concentrating 0 0   Moving slowly or fidgety/restless 0 0   Suicidal thoughts 0 0   PHQ-9 Score 8 9   Difficult doing work/chores Somewhat difficult Very difficult       11/06/2021    3:49 PM 10/03/2021   10:12 AM  GAD 7 : Generalized Anxiety Score  Nervous, Anxious, on Edge 2 1  Control/stop worrying 1 1  Worry too much - different things 2 1  Trouble relaxing 0 2  Restless 2 2  Easily annoyed or irritable 2 3  Afraid - awful might happen 0 0  Total GAD 7 Score 9 10  Anxiety Difficulty Not difficult at all Very difficult     Most recent fall risk assessment:    11/06/2021    3:47 PM  Fall Risk   Falls in the past year? 0     Most recent depression screenings:    11/06/2021    3:47 PM 10/03/2021   10:11 AM  PHQ 2/9 Scores  PHQ - 2 Score 5 4  PHQ- 9 Score  8 9        Patient Care Team: Gwenlyn Perking, FNP as PCP - General (Family Medicine)   Outpatient Medications Prior to Visit  Medication Sig   amLODipine (NORVASC) 5 MG tablet Take 1 tablet (5 mg total) by mouth daily.   [DISCONTINUED] desvenlafaxine (PRISTIQ) 50 MG 24 hr tablet Take 1 tablet (50 mg total) by mouth daily.   No facility-administered medications prior to visit.    Review of Systems  All other systems reviewed and are negative.      Objective:     BP (!) 147/71   Pulse 74   Temp 98.5 F (36.9 C) (Temporal)   Ht _0  (1.854 m)   Wt 192 lb (87.1 kg)   SpO2 95%   BMI 25.33 kg/m  BP Readings from Last 3 Encounters:  11/06/21 (!) 147/71  10/03/21 (!) 159/75  09/13/21 (!) 147/98      Physical Exam Vitals and nursing note reviewed.  Constitutional:      General:  He is not in acute distress.    Appearance: He is not ill-appearing.  HENT:     Head: Normocephalic.     Right Ear: Tympanic membrane, ear canal and external ear normal.     Left Ear: Tympanic membrane, ear canal and external ear normal.     Nose: Nose normal.     Mouth/Throat:     Mouth: Mucous membranes are moist.     Pharynx: Oropharynx is clear.  Eyes:     Extraocular Movements: Extraocular movements intact.     Conjunctiva/sclera: Conjunctivae normal.     Pupils: Pupils are equal, round, and reactive to light.  Cardiovascular:     Rate and Rhythm: Normal rate and regular rhythm.     Pulses: Normal pulses.     Heart sounds: Normal heart sounds. No murmur heard.    No friction rub. No gallop.  Pulmonary:     Effort: Pulmonary effort is normal.     Breath sounds: Normal breath sounds.  Abdominal:     General: Bowel sounds are normal. There is no distension.     Palpations: Abdomen is soft. There is no mass.     Tenderness: There is no abdominal tenderness. There is no guarding.  Musculoskeletal:        General: No swelling. Normal range of motion.     Cervical back: Normal  range of motion and neck supple. No tenderness.     Right lower leg: No edema.     Left lower leg: No edema.  Skin:    General: Skin is warm and dry.     Capillary Refill: Capillary refill takes less than 2 seconds.     Findings: No lesion or rash.  Neurological:     General: No focal deficit present.     Mental Status: He is alert and oriented to person, place, and time.  Psychiatric:        Mood and Affect: Mood normal.        Behavior: Behavior normal.        Thought Content: Thought content normal.      No results found for any visits on 11/06/21.     Assessment & Plan:    Routine Health Maintenance and Physical Exam [name] was seen today for annual exam.  Diagnoses and all orders for this visit:  Routine general medical examination at a health care facility Labs pending as below. TSH was normal last month when checked.  -     CBC with Differential/Platelet -     CMP14+EGFR -     Lipid panel  Primary hypertension BP is not at goal today. BP log given. He will follow up in 2 weeks for BP check. He will bring his log and home monitor.   Depression, major, single episode, moderate (HCC) Anxiety Uncontrolled, denies SI. Stopped pristiq due to fatigue. Start prozac as below. Follow up in 6 weeks.  -     FLUoxetine (PROZAC) 10 MG capsule; Take 1 capsule (10 mg total) by mouth daily.    Immunization History  Administered Date(s) Administered   Influenza, Seasonal, Injecte, Preservative Fre 05/30/2014   Pneumococcal Polysaccharide-23 05/30/2014   Tdap 04/29/2016    Health Maintenance  Topic Date Due   Hepatitis C Screening  Never done   INFLUENZA VACCINE  06/01/2022 (Originally 10/01/2021)   COVID-19 Vaccine (1) 10/20/2022 (Originally 04/08/1986)   TETANUS/TDAP  04/29/2026   HIV Screening  Completed   HPV VACCINES  Aged Out  Discussed health benefits of physical activity, and encouraged him to engage in regular exercise appropriate for his age and  condition.  Problem List Items Addressed This Visit       Cardiovascular and Mediastinum   Primary hypertension     Other   Depression, major, single episode, moderate (HCC)   Relevant Medications   FLUoxetine (PROZAC) 10 MG capsule   Anxiety   Relevant Medications   FLUoxetine (PROZAC) 10 MG capsule   Other Visit Diagnoses     Routine general medical examination at a health care facility    -  Primary   Relevant Orders   CBC with Differential/Platelet   CMP14+EGFR   Lipid panel      Return in about 2 weeks (around 11/20/2021) for with triage for BP check. 6 week follow up with me in office.   The patient indicates understanding of these issues and agrees with the plan.   Gwenlyn Perking, FNP

## 2021-11-06 NOTE — Patient Instructions (Signed)
Health Maintenance, Male Adopting a healthy lifestyle and getting preventive care are important in promoting health and wellness. Ask your health care provider about: The right schedule for you to have regular tests and exams. Things you can do on your own to prevent diseases and keep yourself healthy. What should I know about diet, weight, and exercise? Eat a healthy diet  Eat a diet that includes plenty of vegetables, fruits, low-fat dairy products, and lean protein. Do not eat a lot of foods that are high in solid fats, added sugars, or sodium. Maintain a healthy weight Body mass index (BMI) is a measurement that can be used to identify possible weight problems. It estimates body fat based on height and weight. Your health care provider can help determine your BMI and help you achieve or maintain a healthy weight. Get regular exercise Get regular exercise. This is one of the most important things you can do for your health. Most adults should: Exercise for at least 150 minutes each week. The exercise should increase your heart rate and make you sweat (moderate-intensity exercise). Do strengthening exercises at least twice a week. This is in addition to the moderate-intensity exercise. Spend less time sitting. Even light physical activity can be beneficial. Watch cholesterol and blood lipids Have your blood tested for lipids and cholesterol at 36 years of age, then have this test every 5 years. You may need to have your cholesterol levels checked more often if: Your lipid or cholesterol levels are high. You are older than 36 years of age. You are at high risk for heart disease. What should I know about cancer screening? Many types of cancers can be detected early and may often be prevented. Depending on your health history and family history, you may need to have cancer screening at various ages. This may include screening for: Colorectal cancer. Prostate cancer. Skin cancer. Lung  cancer. What should I know about heart disease, diabetes, and high blood pressure? Blood pressure and heart disease High blood pressure causes heart disease and increases the risk of stroke. This is more likely to develop in people who have high blood pressure readings or are overweight. Talk with your health care provider about your target blood pressure readings. Have your blood pressure checked: Every 3-5 years if you are 18-39 years of age. Every year if you are 40 years old or older. If you are between the ages of 65 and 75 and are a current or former smoker, ask your health care provider if you should have a one-time screening for abdominal aortic aneurysm (AAA). Diabetes Have regular diabetes screenings. This checks your fasting blood sugar level. Have the screening done: Once every three years after age 45 if you are at a normal weight and have a low risk for diabetes. More often and at a younger age if you are overweight or have a high risk for diabetes. What should I know about preventing infection? Hepatitis B If you have a higher risk for hepatitis B, you should be screened for this virus. Talk with your health care provider to find out if you are at risk for hepatitis B infection. Hepatitis C Blood testing is recommended for: Everyone born from 1945 through 1965. Anyone with known risk factors for hepatitis C. Sexually transmitted infections (STIs) You should be screened each year for STIs, including gonorrhea and chlamydia, if: You are sexually active and are younger than 36 years of age. You are older than 36 years of age and your   health care provider tells you that you are at risk for this type of infection. Your sexual activity has changed since you were last screened, and you are at increased risk for chlamydia or gonorrhea. Ask your health care provider if you are at risk. Ask your health care provider about whether you are at high risk for HIV. Your health care provider  may recommend a prescription medicine to help prevent HIV infection. If you choose to take medicine to prevent HIV, you should first get tested for HIV. You should then be tested every 3 months for as long as you are taking the medicine. Follow these instructions at home: Alcohol use Do not drink alcohol if your health care provider tells you not to drink. If you drink alcohol: Limit how much you have to 0-2 drinks a day. Know how much alcohol is in your drink. In the [place] one drink equals one 12 oz bottle of beer (355 mL), one 5 oz glass of wine (148 mL), or one 1 oz glass of hard liquor (44 mL). Lifestyle Do not use any products that contain nicotine or tobacco. These products include cigarettes, chewing tobacco, and vaping devices, such as e-cigarettes. If you need help quitting, ask your health care provider. Do not use street drugs. Do not share needles. Ask your health care provider for help if you need support or information about quitting drugs. General instructions Schedule regular health, dental, and eye exams. Stay current with your vaccines. Tell your health care provider if: You often feel depressed. You have ever been abused or do not feel safe at home. Summary Adopting a healthy lifestyle and getting preventive care are important in promoting health and wellness. Follow your health care provider's instructions about healthy diet, exercising, and getting tested or screened for diseases. Follow your health care provider's instructions on monitoring your cholesterol and blood pressure. This information is not intended to replace advice given to you by your health care provider. Make sure you discuss any questions you have with your health care provider. Document Revised: 07/09/2020 Document Reviewed: 07/09/2020 Elsevier Patient Education  2023 [employer]  

## 2021-11-07 LAB — CMP14+EGFR
ALT: 18 IU/L (ref 0–44)
AST: 25 IU/L (ref 0–40)
Albumin/Globulin Ratio: 2 (ref 1.2–2.2)
Albumin: 4.6 g/dL (ref 4.1–5.1)
Alkaline Phosphatase: 68 IU/L (ref 44–121)
BUN/Creatinine Ratio: 13 (ref 9–20)
BUN: 12 mg/dL (ref 6–20)
Bilirubin Total: 0.3 mg/dL (ref 0.0–1.2)
CO2: 24 mmol/L (ref 20–29)
Calcium: 9.5 mg/dL (ref 8.7–10.2)
Chloride: 101 mmol/L (ref 96–106)
Creatinine, Ser: 0.93 mg/dL (ref 0.76–1.27)
Globulin, Total: 2.3 g/dL (ref 1.5–4.5)
Glucose: 78 mg/dL (ref 70–99)
Potassium: 4 mmol/L (ref 3.5–5.2)
Sodium: 141 mmol/L (ref 134–144)
Total Protein: 6.9 g/dL (ref 6.0–8.5)
eGFR: 109 mL/min/{1.73_m2} (ref >59)

## 2021-11-07 LAB — CBC WITH DIFFERENTIAL/PLATELET
Basophils Absolute: 0.1 10*3/uL (ref 0.0–0.2)
Basos: 1 %
EOS (ABSOLUTE): 0.3 10*3/uL (ref 0.0–0.4)
Eos: 2 %
Hematocrit: 43.6 % (ref 37.5–51.0)
Hemoglobin: 15.1 g/dL (ref 13.0–17.7)
Immature Grans (Abs): 0 10*3/uL (ref 0.0–0.1)
Immature Granulocytes: 0 %
Lymphocytes Absolute: 3.6 10*3/uL — ABNORMAL HIGH (ref 0.7–3.1)
Lymphs: 29 %
MCH: 31.4 pg (ref 26.6–33.0)
MCHC: 34.6 g/dL (ref 31.5–35.7)
MCV: 91 fL (ref 79–97)
Monocytes Absolute: 0.9 10*3/uL (ref 0.1–0.9)
Monocytes: 7 %
Neutrophils Absolute: 7.8 10*3/uL — ABNORMAL HIGH (ref 1.4–7.0)
Neutrophils: 61 %
Platelets: 262 10*3/uL (ref 150–450)
RBC: 4.81 x10E6/uL (ref 4.14–5.80)
RDW: 12.2 % (ref 11.6–15.4)
WBC: 12.7 10*3/uL — ABNORMAL HIGH (ref 3.4–10.8)

## 2021-11-07 LAB — LIPID PANEL
Chol/HDL Ratio: 2.3 ratio (ref 0.0–5.0)
Cholesterol, Total: 156 mg/dL (ref 100–199)
HDL: 67 mg/dL (ref >39)
LDL Chol Calc (NIH): 63 mg/dL (ref 0–99)
Triglycerides: 155 mg/dL — ABNORMAL HIGH (ref 0–149)
VLDL Cholesterol Cal: 26 mg/dL (ref 5–40)

## 2021-11-20 ENCOUNTER — Ambulatory Visit (INDEPENDENT_AMBULATORY_CARE_PROVIDER_SITE_OTHER): Payer: BC Managed Care – PPO

## 2021-11-20 DIAGNOSIS — Z23 Encounter for immunization: Secondary | ICD-10-CM | POA: Diagnosis not present

## 2021-11-20 DIAGNOSIS — I10 Essential (primary) hypertension: Secondary | ICD-10-CM

## 2021-11-20 NOTE — Progress Notes (Signed)
BP at goal today. No changes in medications needed. Continue amlodipine 5 mg daily.

## 2021-11-20 NOTE — Progress Notes (Signed)
Patient here today to have his blood pressure checked and to compare with his home meter.  Patient's blood pressure on our machine was 136/85, pulse 88.  Patient's blood pressure on home meter was 139/87, pulse 86.

## 2021-12-01 ENCOUNTER — Other Ambulatory Visit: Payer: Self-pay | Admitting: Family Medicine

## 2021-12-01 DIAGNOSIS — I10 Essential (primary) hypertension: Secondary | ICD-10-CM

## 2021-12-17 DIAGNOSIS — Z833 Family history of diabetes mellitus: Secondary | ICD-10-CM | POA: Diagnosis not present

## 2021-12-17 DIAGNOSIS — F1020 Alcohol dependence, uncomplicated: Secondary | ICD-10-CM | POA: Diagnosis not present

## 2021-12-17 DIAGNOSIS — F17200 Nicotine dependence, unspecified, uncomplicated: Secondary | ICD-10-CM | POA: Diagnosis not present

## 2021-12-17 DIAGNOSIS — M545 Low back pain, unspecified: Secondary | ICD-10-CM | POA: Diagnosis not present

## 2021-12-17 DIAGNOSIS — Z8616 Personal history of COVID-19: Secondary | ICD-10-CM | POA: Diagnosis not present

## 2021-12-17 DIAGNOSIS — F329 Major depressive disorder, single episode, unspecified: Secondary | ICD-10-CM | POA: Diagnosis not present

## 2021-12-17 DIAGNOSIS — I10 Essential (primary) hypertension: Secondary | ICD-10-CM | POA: Diagnosis not present

## 2021-12-17 DIAGNOSIS — G4700 Insomnia, unspecified: Secondary | ICD-10-CM | POA: Diagnosis not present

## 2021-12-17 DIAGNOSIS — Z8781 Personal history of (healed) traumatic fracture: Secondary | ICD-10-CM | POA: Diagnosis not present

## 2021-12-17 DIAGNOSIS — F1220 Cannabis dependence, uncomplicated: Secondary | ICD-10-CM | POA: Diagnosis not present

## 2021-12-17 DIAGNOSIS — F1120 Opioid dependence, uncomplicated: Secondary | ICD-10-CM | POA: Diagnosis not present

## 2021-12-17 DIAGNOSIS — F419 Anxiety disorder, unspecified: Secondary | ICD-10-CM | POA: Diagnosis not present

## 2021-12-17 DIAGNOSIS — R635 Abnormal weight gain: Secondary | ICD-10-CM | POA: Diagnosis not present

## 2021-12-18 ENCOUNTER — Ambulatory Visit: Payer: BC Managed Care – PPO | Admitting: Family Medicine

## 2021-12-22 DIAGNOSIS — F17200 Nicotine dependence, unspecified, uncomplicated: Secondary | ICD-10-CM | POA: Diagnosis not present

## 2021-12-22 DIAGNOSIS — Z8781 Personal history of (healed) traumatic fracture: Secondary | ICD-10-CM | POA: Diagnosis not present

## 2021-12-22 DIAGNOSIS — Z8616 Personal history of COVID-19: Secondary | ICD-10-CM | POA: Diagnosis not present

## 2021-12-22 DIAGNOSIS — M545 Low back pain, unspecified: Secondary | ICD-10-CM | POA: Diagnosis not present

## 2021-12-22 DIAGNOSIS — G4700 Insomnia, unspecified: Secondary | ICD-10-CM | POA: Diagnosis not present

## 2021-12-22 DIAGNOSIS — Z833 Family history of diabetes mellitus: Secondary | ICD-10-CM | POA: Diagnosis not present

## 2021-12-22 DIAGNOSIS — F419 Anxiety disorder, unspecified: Secondary | ICD-10-CM | POA: Diagnosis not present

## 2021-12-22 DIAGNOSIS — I10 Essential (primary) hypertension: Secondary | ICD-10-CM | POA: Diagnosis not present

## 2021-12-22 DIAGNOSIS — F1220 Cannabis dependence, uncomplicated: Secondary | ICD-10-CM | POA: Diagnosis not present

## 2021-12-22 DIAGNOSIS — F1020 Alcohol dependence, uncomplicated: Secondary | ICD-10-CM | POA: Diagnosis not present

## 2021-12-22 DIAGNOSIS — R635 Abnormal weight gain: Secondary | ICD-10-CM | POA: Diagnosis not present

## 2021-12-22 DIAGNOSIS — F329 Major depressive disorder, single episode, unspecified: Secondary | ICD-10-CM | POA: Diagnosis not present

## 2021-12-22 DIAGNOSIS — F1120 Opioid dependence, uncomplicated: Secondary | ICD-10-CM | POA: Diagnosis not present

## 2021-12-27 DIAGNOSIS — F1020 Alcohol dependence, uncomplicated: Secondary | ICD-10-CM | POA: Diagnosis not present

## 2022-01-06 ENCOUNTER — Ambulatory Visit: Payer: BC Managed Care – PPO | Admitting: Family Medicine

## 2022-01-09 ENCOUNTER — Encounter: Payer: Self-pay | Admitting: Family Medicine

## 2022-01-09 ENCOUNTER — Ambulatory Visit (INDEPENDENT_AMBULATORY_CARE_PROVIDER_SITE_OTHER): Payer: BC Managed Care – PPO | Admitting: Family Medicine

## 2022-01-09 VITALS — BP 129/76 | HR 91 | Temp 98.5°F | Ht 73.0 in | Wt 191.0 lb

## 2022-01-09 DIAGNOSIS — M5416 Radiculopathy, lumbar region: Secondary | ICD-10-CM

## 2022-01-09 DIAGNOSIS — F419 Anxiety disorder, unspecified: Secondary | ICD-10-CM

## 2022-01-09 DIAGNOSIS — Z8659 Personal history of other mental and behavioral disorders: Secondary | ICD-10-CM | POA: Diagnosis not present

## 2022-01-09 DIAGNOSIS — F339 Major depressive disorder, recurrent, unspecified: Secondary | ICD-10-CM

## 2022-01-09 DIAGNOSIS — I10 Essential (primary) hypertension: Secondary | ICD-10-CM

## 2022-01-09 DIAGNOSIS — Z9889 Other specified postprocedural states: Secondary | ICD-10-CM

## 2022-01-09 DIAGNOSIS — R29898 Other symptoms and signs involving the musculoskeletal system: Secondary | ICD-10-CM

## 2022-01-09 MED ORDER — AMLODIPINE BESYLATE 5 MG PO TABS: 5.0000 mg | ORAL_TABLET | Freq: Every day | ORAL | 3 refills | Status: DC

## 2022-01-09 MED ORDER — MELOXICAM 15 MG PO TABS: 15.0000 mg | ORAL_TABLET | Freq: Every day | ORAL | 0 refills | Status: DC

## 2022-01-09 MED ORDER — PREDNISONE 20 MG PO TABS: 40.0000 mg | ORAL_TABLET | Freq: Every day | ORAL | 0 refills | Status: AC

## 2022-01-09 NOTE — Progress Notes (Signed)
Established Patient Office Visit  Subjective   Patient ID: Clinton Evans, male    DOB: 1985/10/19  Age: 36 y.o. MRN: [id number]  Chief Complaint  Patient presents with   Hypertension    HPI HTN Complaint with meds - Yes Current Medications - amlodipine Pertinent ROS:  Visual Disturbances - No Chest pain - No Dyspnea - No Palpitations - No LE edema - No  2. Back pain Clinton Evans reports midline lower back pain that radiates down his right leg to his foot for 2 months. This has been worsening. He also reports that he right leg gives out at times. Activity and bending worsens his symptoms. Has had surgery x 3 on his back (L5 and S1) for similar symptoms. He denies numbness or tingling. Denies changes in bowel or bladder control. Denies saddle anesthesia. No recent injury. He has tried ibuprofen and stretching without improvement. He has had epidural injections in the past without improvement.   3. Depression/anxiety He has stopped drinking alcohol and using marijuana for the last 5-6 weeks. He was really hoping that this would help with his symptoms but he continues to have symptoms of anxiety and depression. He is interested in a referral. Also has a history of ADHD.      01/09/2022   11:41 AM 11/06/2021    3:47 PM 10/03/2021   10:11 AM  Depression screen PHQ 2/9  Decreased Interest 3 3 3   Down, Depressed, Hopeless 1 2 1   PHQ - 2 Score 4 5 4   Altered sleeping 3 2 1   Tired, decreased energy 3 1 2   Change in appetite 0 0 1  Feeling bad or failure about yourself  0 0 1  Trouble concentrating 0 0 0  Moving slowly or fidgety/restless 0 0 0  Suicidal thoughts 0 0 0  PHQ-9 Score 10 8 9   Difficult doing work/chores Not difficult at all Somewhat difficult Very difficult      01/09/2022   11:42 AM 11/06/2021    3:49 PM 10/03/2021   10:12 AM  GAD 7 : Generalized Anxiety Score  Nervous, Anxious, on Edge 2 2 1   Control/stop worrying 1 1 1   Worry too much - different things 2 2 1   Trouble  relaxing 1 0 2  Restless 2 2 2   Easily annoyed or irritable 3 2 3   Afraid - awful might happen 0 0 0  Total GAD 7 Score 11 9 10   Anxiety Difficulty  Not difficult at all Very difficult      Past Medical History:  Diagnosis Date   ADHD    Medical history non-contributory       ROS As per HPI.    Objective:     BP 129/76   Pulse 91   Temp 98.5 F (36.9 C)   Ht 6\' 1"  (1.854 m)   Wt 191 lb (86.6 kg)   SpO2 97%   BMI 25.20 kg/m  BP Readings from Last 3 Encounters:  01/09/22 129/76  11/06/21 (!) 147/71  10/03/21 (!) 159/75      Physical Exam Vitals and nursing note reviewed.  Constitutional:      General: He is not in acute distress.    Appearance: He is not ill-appearing, toxic-appearing or diaphoretic.  HENT:     Head: Normocephalic and atraumatic.  Cardiovascular:     Rate and Rhythm: Normal rate and regular rhythm.     Heart sounds: No murmur heard. Pulmonary:     Effort: Pulmonary effort is  normal.     Breath sounds: Normal breath sounds.  Abdominal:     General: Bowel sounds are normal. There is no distension.     Palpations: Abdomen is soft.     Tenderness: There is no abdominal tenderness. There is no guarding or rebound.  Musculoskeletal:     Lumbar back: Tenderness (midline, left paraspinal) present. No swelling, edema, deformity, signs of trauma or lacerations. Positive right straight leg raise test.     Right lower leg: No edema.     Left lower leg: No edema.  Skin:    General: Skin is warm and dry.  Neurological:     General: No focal deficit present.     Mental Status: He is alert and oriented to person, place, and time.     Motor: No weakness.     Gait: Gait normal.  Psychiatric:        Mood and Affect: Mood normal.        Behavior: Behavior normal.        Thought Content: Thought content normal.        Judgment: Judgment normal.      No results found for any visits on 01/09/22.    The ASCVD Risk score (Arnett DK, et al., 2019)  failed to calculate for the following reasons:   The 2019 ASCVD risk score is only valid for ages 69 to 9    Assessment & Plan:   Clinton Evans was seen today for hypertension.  Diagnoses and all orders for this visit:  Primary hypertension Well controlled on current regimen. Continue amlodipine.  -     amLODipine (NORVASC) 5 MG tablet; Take 1 tablet (5 mg total) by mouth daily.  Depression, recurrent (HCC) Anxiety History of ADHD Uncontrolled. Denies SI. Referral placed.  -     Ambulatory referral to Psychiatry  Lumbar radiculopathy Right leg weakness History of lumbar surgery No cauda equina symptoms. MRI ordered given history of lumbar surgery, referral back to neurosurgery placed. Mobic daily, stretching, heat. Prednisone burst as below.  -     MR Lumbar Spine Wo Contrast; Future -     meloxicam (MOBIC) 15 MG tablet; Take 1 tablet (15 mg total) by mouth daily. -     Ambulatory referral to Neurosurgery -     predniSONE (DELTASONE) 20 MG tablet; Take 2 tablets (40 mg total) by mouth daily with breakfast for 5 days.  Return in about 3 months (around 04/11/2022) for chronic follow up. Sooner for new or worsening symptoms.   The patient indicates understanding of these issues and agrees with the plan.  Gabriel Earing, FNP

## 2022-01-14 ENCOUNTER — Telehealth: Payer: Self-pay | Admitting: Family Medicine

## 2022-01-16 NOTE — Telephone Encounter (Signed)
Insurance will not approve MRI since he has not completed 6 weeks of PT. I had place a referral to neurosurgery as he had been seen by in the past and has a history of lumbar surgery. Will neurosurgery see him? Looks like referral is still pending.

## 2022-01-16 NOTE — Telephone Encounter (Signed)
[facility] is canceling the MRI for tomorrow (11/17) as they did not receive an authorization and it is too late now.  Please let them know when or if you need to reschedule this.

## 2022-01-17 ENCOUNTER — Other Ambulatory Visit: Payer: BC Managed Care – PPO

## 2022-01-21 ENCOUNTER — Encounter (HOSPITAL_COMMUNITY): Payer: Self-pay | Admitting: Psychiatry

## 2022-01-21 ENCOUNTER — Ambulatory Visit (INDEPENDENT_AMBULATORY_CARE_PROVIDER_SITE_OTHER): Payer: BC Managed Care – PPO | Admitting: Psychiatry

## 2022-01-21 DIAGNOSIS — F1011 Alcohol abuse, in remission: Secondary | ICD-10-CM

## 2022-01-21 DIAGNOSIS — F411 Generalized anxiety disorder: Secondary | ICD-10-CM

## 2022-01-21 DIAGNOSIS — F5104 Psychophysiologic insomnia: Secondary | ICD-10-CM | POA: Diagnosis not present

## 2022-01-21 DIAGNOSIS — F909 Attention-deficit hyperactivity disorder, unspecified type: Secondary | ICD-10-CM | POA: Diagnosis not present

## 2022-01-21 DIAGNOSIS — F331 Major depressive disorder, recurrent, moderate: Secondary | ICD-10-CM

## 2022-01-21 DIAGNOSIS — F1211 Cannabis abuse, in remission: Secondary | ICD-10-CM

## 2022-01-21 DIAGNOSIS — G8929 Other chronic pain: Secondary | ICD-10-CM

## 2022-01-21 DIAGNOSIS — F17200 Nicotine dependence, unspecified, uncomplicated: Secondary | ICD-10-CM | POA: Diagnosis not present

## 2022-01-21 DIAGNOSIS — G4700 Insomnia, unspecified: Secondary | ICD-10-CM | POA: Insufficient documentation

## 2022-01-21 DIAGNOSIS — F410 Panic disorder [episodic paroxysmal anxiety] without agoraphobia: Secondary | ICD-10-CM

## 2022-01-21 MED ORDER — MIRTAZAPINE 15 MG PO TABS: ORAL_TABLET | ORAL | 1 refills | Status: DC

## 2022-01-21 NOTE — Patient Instructions (Signed)
We added a new medication to your regimen today; start remeron 7.5mg  (half a tablet) nightly for one week then increase to 15mg  nightly.
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
## 2022-01-27 ENCOUNTER — Telehealth: Payer: Self-pay | Admitting: Family Medicine

## 2022-02-05 ENCOUNTER — Other Ambulatory Visit: Payer: Self-pay | Admitting: Family Medicine

## 2022-02-05 DIAGNOSIS — M5416 Radiculopathy, lumbar region: Secondary | ICD-10-CM

## 2022-02-17 ENCOUNTER — Other Ambulatory Visit: Payer: Self-pay | Admitting: Family Medicine

## 2022-02-17 DIAGNOSIS — F321 Major depressive disorder, single episode, moderate: Secondary | ICD-10-CM

## 2022-02-17 DIAGNOSIS — F419 Anxiety disorder, unspecified: Secondary | ICD-10-CM

## 2022-03-19 ENCOUNTER — Ambulatory Visit: Payer: BC Managed Care – PPO | Admitting: Family Medicine

## 2022-03-19 ENCOUNTER — Encounter: Payer: Self-pay | Admitting: Family Medicine

## 2022-03-19 VITALS — BP 135/77 | HR 100 | Temp 97.8°F | Ht 73.0 in | Wt 205.1 lb

## 2022-03-19 DIAGNOSIS — Z9889 Other specified postprocedural states: Secondary | ICD-10-CM

## 2022-03-19 DIAGNOSIS — I10 Essential (primary) hypertension: Secondary | ICD-10-CM | POA: Diagnosis not present

## 2022-03-19 DIAGNOSIS — F419 Anxiety disorder, unspecified: Secondary | ICD-10-CM

## 2022-03-19 DIAGNOSIS — F339 Major depressive disorder, recurrent, unspecified: Secondary | ICD-10-CM | POA: Diagnosis not present

## 2022-03-19 DIAGNOSIS — M5416 Radiculopathy, lumbar region: Secondary | ICD-10-CM

## 2022-03-19 MED ORDER — AMLODIPINE BESYLATE 5 MG PO TABS: 5.0000 mg | ORAL_TABLET | Freq: Every day | ORAL | 3 refills | Status: DC

## 2022-03-19 NOTE — Progress Notes (Signed)
Established Patient Office Visit  Subjective   Patient ID: Clinton Evans, male    DOB: [birth year]  Age: 37 y.o. MRN: [id number]  Chief Complaint  Patient presents with   Hypertension   Medical Management of Chronic Issues    HPI Clinton Evans is her for a chronic follow up. 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 Numerous old medications were pulling though on Clinton Evans chart and the examiner is request a list of current medications.   HTN Clinton Evans is currently taking amlodipine for HTN. This is Clinton Evans only current medication. Clinton Evans is compliant with this. Clinton Evans reports that Clinton Evans BP is well controlled at home. Clinton Evans denies chest pain, shortness of breath, fatigue, dizziness, visual disturbances, edema, orthopnea, or headaches.   2. Back pain Clinton Evans continues to have midline lower back pain with raidation down the back of Clinton Evans right leg for the last 6-7 months. Clinton Evans has a history of previous lumbar surgery at L5 and S1. The pain is worse with Clinton Evans first few steps and changes in position.  Clinton Evans denies numbness or tingling, saddle anesthesia, or changes in bowel or bladder control, Clinton Evans was referred to neuro surgery but this was approved to for [facility] rather than Dr. Ivan Croft that did Clinton Evans previous surgeries. Insurance could not cover an MRI as Clinton Evans has not completed PT. Clinton Evans has failed PT in the past and does not wish to do this again unable necessary. Denies injury.   3. Depression/anxiety Clinton Evans is not on mediation for anxiety or depression. Clinton Evans denies symptoms. Clinton Evans reports sleeping well. Clinton Evans has maintained Clinton Evans sobriety.       03/19/2022   11:33 AM 01/21/2022    4:33 PM 01/09/2022   11:41 AM  Depression screen PHQ 2/9  Decreased Interest 0  3  Down, Depressed, Hopeless 0  1  PHQ - 2 Score 0  4  Altered sleeping 0  3  Tired, decreased energy 0  3  Change in appetite 0  0  Feeling bad or failure about yourself  0  0  Trouble concentrating 0  0  Moving slowly or fidgety/restless 0  0  Suicidal thoughts 0  0  PHQ-9  Score 0  10  Difficult doing work/chores Not difficult at all  Not difficult at all     Information is confidential and restricted. Go to Review Flowsheets to unlock data.       ROS As per HPI.   Objective:     BP 135/77   Pulse 100   Temp 97.8 F (36.6 C) (Temporal)   Ht 6\' 1"  (1.854 m)   Wt 205 lb 2 oz (93 kg)   BMI 27.06 kg/m  BP Readings from Last 3 Encounters:  03/19/22 135/77  01/09/22 129/76  11/06/21 (!) 147/71      Physical Exam Vitals and nursing note reviewed.  Constitutional:      General: Clinton Evans is not in acute distress.    Appearance: Clinton Evans is not ill-appearing, toxic-appearing or diaphoretic.  Cardiovascular:     Rate and Rhythm: Normal rate and regular rhythm.     Heart sounds: Normal heart sounds. No murmur heard. Pulmonary:     Effort: Pulmonary effort is normal. No respiratory distress.     Breath sounds: Normal breath sounds.  Musculoskeletal:     Lumbar back: Tenderness present. No swelling, edema, deformity, signs of trauma or bony tenderness. Normal range of motion. Positive right straight leg raise test.     Right  lower leg: No edema.     Left lower leg: No edema.  Skin:    General: Skin is warm and dry.  Neurological:     General: No focal deficit present.     Mental Status: Clinton Evans is alert and oriented to person, place, and time.  Psychiatric:        Mood and Affect: Mood normal.        Behavior: Behavior normal.        Thought Content: Thought content normal.        Judgment: Judgment normal.      No results found for any visits on 03/19/22.    The ASCVD Risk score (Arnett DK, et al., 2019) failed to calculate for the following reasons:   The 2019 ASCVD risk score is only valid for ages 54 to 18    Assessment & Plan:   Fines was seen today for hypertension and medical management of chronic issues.  Diagnoses and all orders for this visit:  Primary hypertension Well controlled on current regimen. Continue amlodipine.  -      amLODipine (NORVASC) 5 MG tablet; Take 1 tablet (5 mg total) by mouth daily.  Depression, recurrent ([place]) Anxiety Well controlled without medication.   Lumbar radiculopathy History of lumbar surgery New referral to neurosurgery placed today. No red flags. Continue ibuprofen prn.  -     Ambulatory referral to Neurosurgery  Return in about 8 months (around 11/07/2022) for CPE.   The patient indicates understanding of these issues and agrees with the plan.  Gwenlyn Perking, FNP

## 2022-03-25 DIAGNOSIS — M5431 Sciatica, right side: Secondary | ICD-10-CM | POA: Diagnosis not present

## 2022-03-25 DIAGNOSIS — M5432 Sciatica, left side: Secondary | ICD-10-CM | POA: Diagnosis not present

## 2022-04-01 DIAGNOSIS — M5459 Other low back pain: Secondary | ICD-10-CM | POA: Diagnosis not present

## 2022-04-01 DIAGNOSIS — M5416 Radiculopathy, lumbar region: Secondary | ICD-10-CM | POA: Diagnosis not present

## 2022-04-03 DIAGNOSIS — M5459 Other low back pain: Secondary | ICD-10-CM | POA: Diagnosis not present

## 2022-04-03 DIAGNOSIS — M5416 Radiculopathy, lumbar region: Secondary | ICD-10-CM | POA: Diagnosis not present

## 2022-04-08 DIAGNOSIS — M5416 Radiculopathy, lumbar region: Secondary | ICD-10-CM | POA: Diagnosis not present

## 2022-04-08 DIAGNOSIS — M5459 Other low back pain: Secondary | ICD-10-CM | POA: Diagnosis not present

## 2022-04-10 DIAGNOSIS — M5416 Radiculopathy, lumbar region: Secondary | ICD-10-CM | POA: Diagnosis not present

## 2022-04-10 DIAGNOSIS — M5459 Other low back pain: Secondary | ICD-10-CM | POA: Diagnosis not present

## 2022-04-15 DIAGNOSIS — M5416 Radiculopathy, lumbar region: Secondary | ICD-10-CM | POA: Diagnosis not present

## 2022-04-15 DIAGNOSIS — M5459 Other low back pain: Secondary | ICD-10-CM | POA: Diagnosis not present

## 2022-04-22 DIAGNOSIS — M5459 Other low back pain: Secondary | ICD-10-CM | POA: Diagnosis not present

## 2022-04-22 DIAGNOSIS — M5416 Radiculopathy, lumbar region: Secondary | ICD-10-CM | POA: Diagnosis not present

## 2022-04-24 DIAGNOSIS — M5416 Radiculopathy, lumbar region: Secondary | ICD-10-CM | POA: Diagnosis not present

## 2022-04-24 DIAGNOSIS — M5459 Other low back pain: Secondary | ICD-10-CM | POA: Diagnosis not present

## 2022-04-29 DIAGNOSIS — M5416 Radiculopathy, lumbar region: Secondary | ICD-10-CM | POA: Diagnosis not present

## 2022-04-29 DIAGNOSIS — M5459 Other low back pain: Secondary | ICD-10-CM | POA: Diagnosis not present

## 2022-04-30 DIAGNOSIS — M5432 Sciatica, left side: Secondary | ICD-10-CM | POA: Diagnosis not present

## 2022-04-30 DIAGNOSIS — M5431 Sciatica, right side: Secondary | ICD-10-CM | POA: Diagnosis not present

## 2022-04-30 DIAGNOSIS — M961 Postlaminectomy syndrome, not elsewhere classified: Secondary | ICD-10-CM | POA: Diagnosis not present

## 2022-05-01 ENCOUNTER — Other Ambulatory Visit: Payer: Self-pay | Admitting: Orthopedic Surgery

## 2022-05-01 DIAGNOSIS — M5459 Other low back pain: Secondary | ICD-10-CM | POA: Diagnosis not present

## 2022-05-01 DIAGNOSIS — M5116 Intervertebral disc disorders with radiculopathy, lumbar region: Secondary | ICD-10-CM

## 2022-05-01 DIAGNOSIS — M5416 Radiculopathy, lumbar region: Secondary | ICD-10-CM | POA: Diagnosis not present

## 2022-05-01 DIAGNOSIS — M48061 Spinal stenosis, lumbar region without neurogenic claudication: Secondary | ICD-10-CM

## 2022-05-06 ENCOUNTER — Telehealth: Payer: BC Managed Care – PPO | Admitting: Physician Assistant

## 2022-05-06 ENCOUNTER — Encounter: Payer: Self-pay | Admitting: Orthopedic Surgery

## 2022-05-06 DIAGNOSIS — B9789 Other viral agents as the cause of diseases classified elsewhere: Secondary | ICD-10-CM | POA: Diagnosis not present

## 2022-05-06 DIAGNOSIS — M5416 Radiculopathy, lumbar region: Secondary | ICD-10-CM | POA: Diagnosis not present

## 2022-05-06 DIAGNOSIS — M5459 Other low back pain: Secondary | ICD-10-CM | POA: Diagnosis not present

## 2022-05-06 DIAGNOSIS — J019 Acute sinusitis, unspecified: Secondary | ICD-10-CM

## 2022-05-06 MED ORDER — FLUTICASONE PROPIONATE 50 MCG/ACT NA SUSP: 2.0000 | Freq: Every day | NASAL | 0 refills | Status: DC

## 2022-05-06 NOTE — Progress Notes (Signed)
I have spent 5 minutes in review of e-visit questionnaire, review and updating patient chart, medical decision making and response to patient.   Daziah Hesler Cody Rozina Pointer, PA-C    

## 2022-05-06 NOTE — Progress Notes (Signed)
E-Visit for Sinus Problems  We are sorry that you are not feeling well.  Here is how we plan to help!  Based on what you have shared with me it looks like you have sinusitis.  Sinusitis is inflammation and infection in the sinus cavities of the head.  Based on your presentation I believe you most likely have Acute Viral Sinusitis.This is an infection most likely caused by a virus. There is not specific treatment for viral sinusitis other than to help you with the symptoms until the infection runs its course.  You may use an oral decongestant such as Mucinex D or if you have glaucoma or high blood pressure use plain Mucinex. Saline nasal spray help and can safely be used as often as needed for congestion, I have prescribed: Fluticasone nasal spray two sprays in each nostril once a day  Some authorities believe that zinc sprays or the use of Echinacea may shorten the course of your symptoms.  Sinus infections are not as easily transmitted as other respiratory infection, however we still recommend that you avoid close contact with loved ones, especially the very young and elderly.  Remember to wash your hands thoroughly throughout the day as this is the number one way to prevent the spread of infection!  Home Care: Only take medications as instructed by your medical team. Do not take these medications with alcohol. A steam or ultrasonic humidifier can help congestion.  You can place a towel over your head and breathe in the steam from hot water coming from a faucet. Avoid close contacts especially the very young and the elderly. Cover your mouth when you cough or sneeze. Always remember to wash your hands.  Get Help Right Away If: You develop worsening fever or sinus pain. You develop a severe head ache or visual changes. Your symptoms persist after you have completed your treatment plan.  Make sure you Understand these instructions. Will watch your condition. Will get help right away if you  are not doing well or get worse.   Thank you for choosing an e-visit.  Your e-visit answers were reviewed by a board certified advanced clinical practitioner to complete your personal care plan. Depending upon the condition, your plan could have included both over the counter or prescription medications.  Please review your pharmacy choice. Make sure the pharmacy is open so you can pick up prescription now. If there is a problem, you may contact your provider through MyChart messaging and have the prescription routed to another pharmacy.  Your safety is important to us. If you have drug allergies check your prescription carefully.   For the next 24 hours you can use MyChart to ask questions about today's visit, request a non-urgent call back, or ask for a work or school excuse. You will get an email in the next two days asking about your experience. I hope that your e-visit has been valuable and will speed your recovery.   

## 2022-05-13 ENCOUNTER — Telehealth: Payer: Self-pay | Admitting: Family Medicine

## 2022-05-16 ENCOUNTER — Ambulatory Visit
Admission: RE | Admit: 2022-05-16 | Discharge: 2022-05-16 | Disposition: A | Discharge status: Home / Self Care | Payer: BC Managed Care – PPO | Source: Ambulatory Visit | Attending: Orthopedic Surgery | Admitting: Orthopedic Surgery

## 2022-05-16 DIAGNOSIS — M48061 Spinal stenosis, lumbar region without neurogenic claudication: Secondary | ICD-10-CM

## 2022-05-16 DIAGNOSIS — M5116 Intervertebral disc disorders with radiculopathy, lumbar region: Secondary | ICD-10-CM

## 2022-05-16 DIAGNOSIS — M5126 Other intervertebral disc displacement, lumbar region: Secondary | ICD-10-CM | POA: Diagnosis not present

## 2022-05-16 MED ORDER — GADOPICLENOL 0.5 MMOL/ML IV SOLN
10.0000 mL | Freq: Once | INTRAVENOUS | Status: AC | PRN
  Administered 2022-05-16: 10 mL via INTRAVENOUS

## 2022-05-20 DIAGNOSIS — M48062 Spinal stenosis, lumbar region with neurogenic claudication: Secondary | ICD-10-CM | POA: Diagnosis not present

## 2022-05-20 DIAGNOSIS — M961 Postlaminectomy syndrome, not elsewhere classified: Secondary | ICD-10-CM | POA: Diagnosis not present

## 2022-05-21 DIAGNOSIS — M48061 Spinal stenosis, lumbar region without neurogenic claudication: Secondary | ICD-10-CM | POA: Diagnosis not present

## 2022-05-21 DIAGNOSIS — M961 Postlaminectomy syndrome, not elsewhere classified: Secondary | ICD-10-CM | POA: Diagnosis not present

## 2022-05-27 DIAGNOSIS — M961 Postlaminectomy syndrome, not elsewhere classified: Secondary | ICD-10-CM | POA: Diagnosis not present

## 2022-05-27 DIAGNOSIS — M48062 Spinal stenosis, lumbar region with neurogenic claudication: Secondary | ICD-10-CM | POA: Diagnosis not present

## 2022-05-27 DIAGNOSIS — Z6826 Body mass index (BMI) 26.0-26.9, adult: Secondary | ICD-10-CM | POA: Diagnosis not present

## 2022-06-23 DIAGNOSIS — M5116 Intervertebral disc disorders with radiculopathy, lumbar region: Secondary | ICD-10-CM | POA: Diagnosis not present

## 2022-06-24 DIAGNOSIS — M961 Postlaminectomy syndrome, not elsewhere classified: Secondary | ICD-10-CM | POA: Diagnosis not present

## 2022-06-24 DIAGNOSIS — M5431 Sciatica, right side: Secondary | ICD-10-CM | POA: Diagnosis not present

## 2022-06-24 DIAGNOSIS — M48062 Spinal stenosis, lumbar region with neurogenic claudication: Secondary | ICD-10-CM | POA: Diagnosis not present

## 2022-07-02 ENCOUNTER — Encounter: Payer: Self-pay | Admitting: Family Medicine

## 2022-07-02 ENCOUNTER — Ambulatory Visit: Payer: BC Managed Care – PPO | Admitting: Family Medicine

## 2022-07-02 VITALS — BP 138/75 | HR 81 | Temp 98.7°F | Ht 73.0 in | Wt 206.2 lb

## 2022-07-02 DIAGNOSIS — Z720 Tobacco use: Secondary | ICD-10-CM

## 2022-07-02 DIAGNOSIS — F17210 Nicotine dependence, cigarettes, uncomplicated: Secondary | ICD-10-CM | POA: Diagnosis not present

## 2022-07-02 MED ORDER — VARENICLINE TARTRATE 0.5 MG PO TABS: ORAL_TABLET | ORAL | 0 refills | Status: DC

## 2022-07-02 MED ORDER — VARENICLINE TARTRATE 1 MG PO TABS: 1.0000 mg | ORAL_TABLET | Freq: Two times a day (BID) | ORAL | 0 refills | Status: DC

## 2022-07-02 NOTE — Patient Instructions (Signed)
Varenicline Tablets What is this medication? VARENICLINE (var e NI kleen) helps you quit smoking. It reduces cravings for nicotine, the addictive substance found in tobacco. It is most effective when used in combination with a stop-smoking program. This medicine may be used for other purposes; ask your health care provider or pharmacist if you have questions. COMMON BRAND NAME(S): Chantix What should I tell my care team before I take this medication? They need to know if you have any of these conditions: Heart disease Frequently drink alcohol Kidney disease Mental health condition On hemodialysis Seizures History of stroke Suicidal thoughts, plans, or attempt by you or a family member An unusual or allergic reaction to varenicline, other medications, foods, dyes, or preservatives Pregnant or trying to get pregnant Breast-feeding How should I use this medication? Take this medication by mouth after eating. Take with a full glass of water. Follow the directions on the prescription label. Take your doses at regular intervals. Do not take your medication more often than directed. There are 3 ways you can use this medication to help you quit smoking; talk to your care team to decide which plan is right for you: 1) you can choose a quit date and start this medication 1 week before the quit date, or, 2) you can start taking this medication before you choose a quit date, and then pick a quit date between day 8 and 35 days of treatment, or, 3) if you are not sure that you are able or willing to quit smoking right away, start taking this medication and slowly decrease the amount you smoke as directed by your care team with the goal of being cigarette-free by week 12 of treatment. Stick to your plan; ask about support groups or other ways to help you remain cigarette-free. If you are motivated to quit smoking and did not succeed during a previous attempt with this medication for reasons other than side  effects, or if you returned to smoking after this treatment, speak with your care team about whether another course of this medication may be right for you. A special MedGuide will be given to you by the pharmacist with each prescription and refill. Be sure to read this information carefully each time. Talk to your care team about the use of this medication in children. This medication is not approved for use in children. Overdosage: If you think you have taken too much of this medicine contact a poison control center or emergency room at once. NOTE: This medicine is only for you. Do not share this medicine with others. What if I miss a dose? If you miss a dose, take it as soon as you can. If it is almost time for your next dose, take only that dose. Do not take double or extra doses. What may interact with this medication? Alcohol Insulin Other medications used to help people quit smoking Theophylline Warfarin This list may not describe all possible interactions. Give your health care provider a list of all the medicines, herbs, non-prescription drugs, or dietary supplements you use. Also tell them if you smoke, drink alcohol, or use illegal drugs. Some items may interact with your medicine. What should I watch for while using this medication? It is okay if you do not succeed at your attempt to quit and have a cigarette. You can still continue your quit attempt and keep using this medication as directed. Just throw away your cigarettes and get back to your quit plan. Talk to your care team   before using other treatments to quit smoking. Using this medication with other treatments to quit smoking may increase the risk for side effects compared to using a treatment alone. This medication may affect your coordination, reaction time, or judgment. Do not drive or operate machinery until you know how this medication affects you. Sit up or stand slowly to reduce the risk of dizzy or fainting  spells. Decrease the number of alcoholic beverages that you drink during treatment with this medication until you know if this medication affects your ability to tolerate alcohol. Some people have experienced increased drunkenness (intoxication), unusual or sometimes aggressive behavior, or no memory of things that have happened (amnesia) during treatment with this medication. You may do unusual sleep behaviors or activities you do not remember the day after taking this medication. Activities include driving, making or eating food, talking on the phone, sexual activity, or sleep walking. Stop taking this medication and call your care team right away if you find out you have done activities like this. Patients and their families should watch out for new or worsening depression or thoughts of suicide. Also watch out for sudden changes in feelings such as feeling anxious, agitated, panicky, irritable, hostile, aggressive, impulsive, severely restless, overly excited and hyperactive, or not being able to sleep. If this happens, call your care team. If you have diabetes, and you quit smoking, the effects of insulin may be increased. You may need to reduce your insulin dose. Check with your care team about how you should adjust your insulin dose. What side effects may I notice from receiving this medication? Side effects that you should report to your care team as soon as possible: Allergic reactions or angioedema--skin rash, itching or hives, swelling of the face, eyes, lips, tongue, arms, or legs, trouble swallowing or breathing Heart attack--pain or tightness in the chest, shoulders, arms, or jaw, nausea, shortness of breath, cold or clammy skin, feeling faint or lightheaded Mood and behavior changes--anxiety, nervousness, confusion, hallucinations, irritability, hostility, thoughts of suicide or self-harm, worsening mood, feelings of depression Redness, blistering, peeling, or loosening of the skin,  including inside the mouth Stroke--sudden numbness or weakness of the face, arm, or leg, trouble speaking, confusion, trouble walking, loss of balance or coordination, dizziness, severe headache, change in vision Seizures Side effects that usually do not require medical attention (report to your care team if they continue or are bothersome): Constipation Drowsiness Gas Nausea Trouble sleeping Upset stomach Vivid dreams or nightmares Vomiting This list may not describe all possible side effects. Call your doctor for medical advice about side effects. You may report side effects to FDA at 1-800-FDA-1088. Where should I keep my medication? Keep out of the reach of children and pets. Store at room temperature between 15 and 30 degrees C (59 and 86 degrees F). Throw away any unused medication after the expiration date. NOTE: This sheet is a summary. It may not cover all possible information. If you have questions about this medicine, talk to your doctor, pharmacist, or health care provider.  2023 Elsevier/Gold Standard (2021-01-09 00:00:00)  

## 2022-07-02 NOTE — Progress Notes (Signed)
Acute Office Visit  Subjective:     Patient ID: Clinton Evans, male    DOB: 1985/06/14, 37 y.o.   MRN: [id number]  Chief Complaint  Patient presents with   Nicotine Dependence    HPI Patient is in today for smoking cessation treatment. He is currently smoking 1 PPD. He needs to have lumbar surgery. His insurance is requiring him to stop smoking before they will approve surgery. He has tried many times in the past to quit but has only made it for a week of so before restarting. He is interested in chantix. He has never tried chantix or wellbutrin before. He is motivated to have the surgery done to reduce his pian and knows that it would be best for his health as well.      07/02/2022    4:21 PM 03/19/2022   11:33 AM 01/21/2022    4:33 PM  Depression screen PHQ 2/9  Decreased Interest 0 0   Down, Depressed, Hopeless 0 0   PHQ - 2 Score 0 0   Altered sleeping 0 0   Tired, decreased energy 0 0   Change in appetite 0 0   Feeling bad or failure about yourself  0 0   Trouble concentrating 0 0   Moving slowly or fidgety/restless 0 0   Suicidal thoughts 0 0   PHQ-9 Score 0 0   Difficult doing work/chores Not difficult at all Not difficult at all      Information is confidential and restricted. Go to Review Flowsheets to unlock data.      07/02/2022    4:21 PM 03/19/2022   11:34 AM 01/09/2022   11:42 AM 11/06/2021    3:49 PM  GAD 7 : Generalized Anxiety Score  Nervous, Anxious, on Edge 0 0 2 2  Control/stop worrying 0 0 1 1  Worry too much - different things 0 0 2 2  Trouble relaxing 0 0 1 0  Restless 0 0 2 2  Easily annoyed or irritable 0 1 3 2   Afraid - awful might happen 0 0 0 0  Total GAD 7 Score 0 1 11 9   Anxiety Difficulty Not difficult at all Not difficult at all  Not difficult at all     ROS As per HPI.      Objective:    BP 138/75   Pulse 81   Temp 98.7 F (37.1 C) (Temporal)   Ht 6\' 1"  (1.854 m)   Wt 206 lb 4 oz (93.6 kg)   SpO2 97%   BMI 27.21 kg/m     Physical Exam Vitals and nursing note reviewed.  Constitutional:      General: He is not in acute distress.    Appearance: Normal appearance. He is not ill-appearing, toxic-appearing or diaphoretic.  Cardiovascular:     Rate and Rhythm: Normal rate and regular rhythm.     Heart sounds: Normal heart sounds. No murmur heard. Pulmonary:     Effort: Pulmonary effort is normal. No respiratory distress.     Breath sounds: Normal breath sounds.  Musculoskeletal:     Right lower leg: No edema.     Left lower leg: No edema.  Skin:    General: Skin is warm and dry.  Neurological:     General: No focal deficit present.     Mental Status: He is alert and oriented to person, place, and time.  Psychiatric:        Mood and Affect: Mood normal.  Behavior: Behavior normal.        Thought Content: Thought content normal.        Judgment: Judgment normal.     No results found for any visits on 07/02/22.      Assessment & Plan:   Kirtan was seen today for nicotine dependence.  Diagnoses and all orders for this visit:  Tobacco abuse Chantix ordered and discussed. Discussed possible side effects. Discussed a quit date of 1 week from starting chantix. He will message me in a few weeks to let me know how he is doing, sooner for any side effects.  -     varenicline (CHANTIX) 0.5 MG tablet; On days 1 to 3: take 0.5 mg once by mouth once daily. On days 4 to 7: 0.5 mg by mouth twice daily. -     varenicline (CHANTIX CONTINUING MONTH PAK) 1 MG tablet; Take 1 tablet (1 mg total) by mouth 2 (two) times daily.   Return if symptoms worsen or fail to improve.  The patient indicates understanding of these issues and agrees with the plan.  Gabriel Earing, FNP

## 2022-07-21 DIAGNOSIS — M961 Postlaminectomy syndrome, not elsewhere classified: Secondary | ICD-10-CM | POA: Diagnosis not present

## 2022-07-21 DIAGNOSIS — M48062 Spinal stenosis, lumbar region with neurogenic claudication: Secondary | ICD-10-CM | POA: Diagnosis not present

## 2022-08-18 DIAGNOSIS — M5116 Intervertebral disc disorders with radiculopathy, lumbar region: Secondary | ICD-10-CM | POA: Diagnosis not present

## 2022-09-16 DIAGNOSIS — M961 Postlaminectomy syndrome, not elsewhere classified: Secondary | ICD-10-CM | POA: Diagnosis not present

## 2022-09-16 DIAGNOSIS — M5116 Intervertebral disc disorders with radiculopathy, lumbar region: Secondary | ICD-10-CM | POA: Diagnosis not present

## 2022-11-07 ENCOUNTER — Encounter: Payer: Self-pay | Admitting: Family Medicine

## 2022-11-07 ENCOUNTER — Ambulatory Visit: Payer: BLUE CROSS/BLUE SHIELD | Admitting: Family Medicine

## 2022-11-07 VITALS — BP 138/82 | HR 70 | Temp 98.0°F | Ht 73.0 in | Wt 210.1 lb

## 2022-11-07 DIAGNOSIS — I10 Essential (primary) hypertension: Secondary | ICD-10-CM

## 2022-11-07 DIAGNOSIS — E559 Vitamin D deficiency, unspecified: Secondary | ICD-10-CM

## 2022-11-07 DIAGNOSIS — G4700 Insomnia, unspecified: Secondary | ICD-10-CM

## 2022-11-07 DIAGNOSIS — Z720 Tobacco use: Secondary | ICD-10-CM

## 2022-11-07 DIAGNOSIS — Z0001 Encounter for general adult medical examination with abnormal findings: Secondary | ICD-10-CM

## 2022-11-07 DIAGNOSIS — Z0000 Encounter for general adult medical examination without abnormal findings: Secondary | ICD-10-CM

## 2022-11-07 MED ORDER — VARENICLINE TARTRATE 0.5 MG PO TABS: ORAL_TABLET | ORAL | 0 refills | Status: DC

## 2022-11-07 MED ORDER — DOXEPIN HCL 3 MG PO TABS: 3.0000 mg | ORAL_TABLET | Freq: Every day | ORAL | 1 refills | Status: DC

## 2022-11-07 MED ORDER — VARENICLINE TARTRATE 1 MG PO TABS: 1.0000 mg | ORAL_TABLET | Freq: Two times a day (BID) | ORAL | 0 refills | Status: DC

## 2022-11-07 NOTE — Patient Instructions (Signed)
Health Maintenance, Male Adopting a healthy lifestyle and getting preventive care are important in promoting health and wellness. Ask your health care provider about: The right schedule for you to have regular tests and exams. Things you can do on your own to prevent diseases and keep yourself healthy. What should I know about diet, weight, and exercise? Eat a healthy diet  Eat a diet that includes plenty of vegetables, fruits, low-fat dairy products, and lean protein. Do not eat a lot of foods that are high in solid fats, added sugars, or sodium. Maintain a healthy weight Body mass index (BMI) is a measurement that can be used to identify possible weight problems. It estimates body fat based on height and weight. Your health care provider can help determine your BMI and help you achieve or maintain a healthy weight. Get regular exercise Get regular exercise. This is one of the most important things you can do for your health. Most adults should: Exercise for at least 150 minutes each week. The exercise should increase your heart rate and make you sweat (moderate-intensity exercise). Do strengthening exercises at least twice a week. This is in addition to the moderate-intensity exercise. Spend less time sitting. Even light physical activity can be beneficial. Watch cholesterol and blood lipids Have your blood tested for lipids and cholesterol at 37 years of age, then have this test every 5 years. You may need to have your cholesterol levels checked more often if: Your lipid or cholesterol levels are high. You are older than 37 years of age. You are at high risk for heart disease. What should I know about cancer screening? Many types of cancers can be detected early and may often be prevented. Depending on your health history and family history, you may need to have cancer screening at various ages. This may include screening for: Colorectal cancer. Prostate cancer. Skin cancer. Lung  cancer. What should I know about heart disease, diabetes, and high blood pressure? Blood pressure and heart disease High blood pressure causes heart disease and increases the risk of stroke. This is more likely to develop in people who have high blood pressure readings or are overweight. Talk with your health care provider about your target blood pressure readings. Have your blood pressure checked: Every 3-5 years if you are 18-39 years of age. Every year if you are 40 years old or older. If you are between the ages of 65 and 75 and are a current or former smoker, ask your health care provider if you should have a one-time screening for abdominal aortic aneurysm (AAA). Diabetes Have regular diabetes screenings. This checks your fasting blood sugar level. Have the screening done: Once every three years after age 45 if you are at a normal weight and have a low risk for diabetes. More often and at a younger age if you are overweight or have a high risk for diabetes. What should I know about preventing infection? Hepatitis B If you have a higher risk for hepatitis B, you should be screened for this virus. Talk with your health care provider to find out if you are at risk for hepatitis B infection. Hepatitis C Blood testing is recommended for: Everyone born from 1945 through 1965. Anyone with known risk factors for hepatitis C. Sexually transmitted infections (STIs) You should be screened each year for STIs, including gonorrhea and chlamydia, if: You are sexually active and are younger than 37 years of age. You are older than 37 years of age and your   health care provider tells you that you are at risk for this type of infection. Your sexual activity has changed since you were last screened, and you are at increased risk for chlamydia or gonorrhea. Ask your health care provider if you are at risk. Ask your health care provider about whether you are at high risk for HIV. Your health care provider  may recommend a prescription medicine to help prevent HIV infection. If you choose to take medicine to prevent HIV, you should first get tested for HIV. You should then be tested every 3 months for as long as you are taking the medicine. Follow these instructions at home: Alcohol use Do not drink alcohol if your health care provider tells you not to drink. If you drink alcohol: Limit how much you have to 0-2 drinks a day. Know how much alcohol is in your drink. In the [place] one drink equals one 12 oz bottle of beer (355 mL), one 5 oz glass of wine (148 mL), or one 1 oz glass of hard liquor (44 mL). Lifestyle Do not use any products that contain nicotine or tobacco. These products include cigarettes, chewing tobacco, and vaping devices, such as e-cigarettes. If you need help quitting, ask your health care provider. Do not use street drugs. Do not share needles. Ask your health care provider for help if you need support or information about quitting drugs. General instructions Schedule regular health, dental, and eye exams. Stay current with your vaccines. Tell your health care provider if: You often feel depressed. You have ever been abused or do not feel safe at home. Summary Adopting a healthy lifestyle and getting preventive care are important in promoting health and wellness. Follow your health care provider's instructions about healthy diet, exercising, and getting tested or screened for diseases. Follow your health care provider's instructions on monitoring your cholesterol and blood pressure. This information is not intended to replace advice given to you by your health care provider. Make sure you discuss any questions you have with your health care provider. Document Revised: 07/09/2020 Document Reviewed: 07/09/2020 Elsevier Patient Education  2024 [employer]  

## 2022-11-07 NOTE — Progress Notes (Signed)
Complete physical exam  Patient: Clinton Evans   DOB: 02/04/1986   37 y.o. Male  MRN: [id number]  Subjective:    Chief Complaint  Patient presents with   Annual Exam    Clinton Evans is a 37 y.o. male who presents today for a complete physical exam. He reports consuming a general diet. The patient has a physically strenuous job, but has no regular exercise apart from work.  He generally feels fairly well. He reports sleeping poorly. He does have additional problems to discuss today.    He has had difficulty sleeping for the last 2 months. He has been working swing shifts. He has trouble both falling and staying asleep. He has tried multiple OTC options with out relief.    He completed chantix a few months ago. He has quit but then starting smoking 2-3 cigarettes a day. He would like to try chantix again. He did not have any side effects with it.   Most recent fall risk assessment:    07/02/2022    4:21 PM  Fall Risk   Falls in the past year? 0     Most recent depression screenings:    11/07/2022    8:13 AM 07/02/2022    4:21 PM 03/19/2022   11:33 AM  Depression screen PHQ 2/9  Decreased Interest 0 0 0  Down, Depressed, Hopeless 0 0 0  PHQ - 2 Score 0 0 0  Altered sleeping 0 0 0  Tired, decreased energy 0 0 0  Change in appetite 0 0 0  Feeling bad or failure about yourself  0 0 0  Trouble concentrating 0 0 0  Moving slowly or fidgety/restless 0 0 0  Suicidal thoughts 0 0 0  PHQ-9 Score 0 0 0  Difficult doing work/chores Not difficult at all Not difficult at all Not difficult at all      11/07/2022    8:13 AM 07/02/2022    4:21 PM 03/19/2022   11:34 AM 01/09/2022   11:42 AM  GAD 7 : Generalized Anxiety Score  Nervous, Anxious, on Edge 0 0 0 2  Control/stop worrying 0 0 0 1  Worry too much - different things 0 0 0 2  Trouble relaxing 0 0 0 1  Restless 0 0 0 2  Easily annoyed or irritable 0 0 1 3  Afraid - awful might happen 0 0 0 0  Total GAD 7 Score 0 0 1 11  Anxiety  Difficulty Not difficult at all Not difficult at all Not difficult at all       Vision:Not within last year  and Dental: No current dental problems and Receives regular dental care  Past Medical History:  Diagnosis Date   ADHD    Depression 04/29/2016   Medical history non-contributory       Patient Care Team: Gabriel Earing, FNP as PCP - General (Family Medicine)   Outpatient Medications Prior to Visit  Medication Sig   amLODipine (NORVASC) 5 MG tablet Take 1 tablet (5 mg total) by mouth daily.   gabapentin (NEURONTIN) 300 MG capsule Take 600 mg by mouth at bedtime.   [DISCONTINUED] varenicline (CHANTIX CONTINUING MONTH PAK) 1 MG tablet Take 1 tablet (1 mg total) by mouth 2 (two) times daily.   [DISCONTINUED] varenicline (CHANTIX) 0.5 MG tablet On days 1 to 3: take 0.5 mg once by mouth once daily. On days 4 to 7: 0.5 mg by mouth twice daily.   No facility-administered medications  prior to visit.    ROS Negative unless specially indicated above in HPI.     Objective:     BP 138/82   Pulse 70   Temp 98 F (36.7 C) (Temporal)   Ht 6\' 1"  (1.854 m)   Wt 210 lb 2 oz (95.3 kg)   SpO2 99%   BMI 27.72 kg/m    Physical Exam Vitals and nursing note reviewed.  Constitutional:      General: He is not in acute distress.    Appearance: He is not ill-appearing, toxic-appearing or diaphoretic.  HENT:     Head: Normocephalic.     Right Ear: Tympanic membrane, ear canal and external ear normal.     Left Ear: Tympanic membrane, ear canal and external ear normal.     Nose: Nose normal.     Mouth/Throat:     Mouth: Mucous membranes are moist.     Pharynx: Oropharynx is clear.  Eyes:     Extraocular Movements: Extraocular movements intact.     Conjunctiva/sclera: Conjunctivae normal.     Pupils: Pupils are equal, round, and reactive to light.  Neck:     Thyroid: No thyroid mass, thyromegaly or thyroid tenderness.  Cardiovascular:     Rate and Rhythm: Normal rate and  regular rhythm.     Pulses: Normal pulses.     Heart sounds: Normal heart sounds. No murmur heard.    No friction rub. No gallop.  Pulmonary:     Effort: Pulmonary effort is normal.     Breath sounds: Normal breath sounds.  Abdominal:     General: Bowel sounds are normal. There is no distension.     Palpations: Abdomen is soft. There is no mass.     Tenderness: There is no abdominal tenderness. There is no guarding.  Musculoskeletal:     Cervical back: Normal range of motion and neck supple. No tenderness.     Right lower leg: No edema.     Left lower leg: No edema.  Skin:    General: Skin is warm and dry.     Capillary Refill: Capillary refill takes less than 2 seconds.     Findings: No lesion or rash.  Neurological:     General: No focal deficit present.     Mental Status: He is alert and oriented to person, place, and time.  Psychiatric:        Mood and Affect: Mood normal.        Behavior: Behavior normal.        Thought Content: Thought content normal.        Judgment: Judgment normal.      No results found for any visits on 11/07/22.     Assessment & Plan:    Routine Health Maintenance and Physical Exam  Ruven was seen today for annual exam.  Diagnoses and all orders for this visit:  Routine general medical examination at a health care facility  Primary hypertension Well controlled on current regimen.  -     CBC with Differential/Platelet -     CMP14+EGFR -     Lipid panel -     TSH  Mixed insomnia Will try doxepin as below. He will let me know has he is doing in a few weeks.  -     Doxepin HCl 3 MG TABS; Take 1 tablet (3 mg total) by mouth at bedtime.  Tobacco abuse 3 mins Smoking cessation instruction/counseling given:  counseled patient on the dangers of  tobacco use, advised patient to stop smoking, and reviewed strategies to maximize success Will do another round of chantix. He has cut back significantly after round earlier this year.  -      varenicline (CHANTIX) 0.5 MG tablet; On days 1 to 3: take 0.5 mg once by mouth once daily. On days 4 to 7: 0.5 mg by mouth twice daily. -     varenicline (CHANTIX CONTINUING MONTH PAK) 1 MG tablet; Take 1 tablet (1 mg total) by mouth 2 (two) times daily.  Vitamin D deficiency Labs pending.  -     VITAMIN D 25 Hydroxy (Vit-D Deficiency, Fractures)    Immunization History  Administered Date(s) Administered   Influenza, Seasonal, Injecte, Preservative Fre 05/30/2014   Influenza,inj,Quad PF,6+ Mos 11/20/2021   Pneumococcal Polysaccharide-23 05/30/2014   Tdap 04/29/2016    Health Maintenance  Topic Date Due   Hepatitis C Screening  03/20/2023 (Originally 10/07/2003)   INFLUENZA VACCINE  06/01/2023 (Originally 10/02/2022)   COVID-19 Vaccine (1 - 2023-24 season) 11/23/2023 (Originally 11/02/2022)   DTaP/Tdap/Td (2 - Td or Tdap) 04/29/2026   HIV Screening  Completed   HPV VACCINES  Aged Out    Discussed health benefits of physical activity, and encouraged him to engage in regular exercise appropriate for his age and condition.  Problem List Items Addressed This Visit       Cardiovascular and Mediastinum   Primary hypertension   Relevant Orders   CBC with Differential/Platelet   CMP14+EGFR   Lipid panel   TSH     Other   Insomnia (Chronic)   Relevant Medications   Doxepin HCl 3 MG TABS   Vitamin D deficiency   Relevant Orders   VITAMIN D 25 Hydroxy (Vit-D Deficiency, Fractures)   Other Visit Diagnoses     Routine general medical examination at a health care facility    -  Primary   Tobacco abuse       Relevant Medications   varenicline (CHANTIX) 0.5 MG tablet   varenicline (CHANTIX CONTINUING MONTH PAK) 1 MG tablet      Return in about 6 months (around 05/07/2023) for chronic follow up.  The patient indicates understanding of these issues and agrees with the plan.  Gabriel Earing, FNP

## 2022-11-08 LAB — CMP14+EGFR
ALT: 26 IU/L (ref 0–44)
AST: 23 IU/L (ref 0–40)
Albumin: 4.4 g/dL (ref 4.1–5.1)
Alkaline Phosphatase: 72 IU/L (ref 44–121)
BUN/Creatinine Ratio: 11 (ref 9–20)
BUN: 11 mg/dL (ref 6–20)
Bilirubin Total: 0.3 mg/dL (ref 0.0–1.2)
CO2: 22 mmol/L (ref 20–29)
Calcium: 9.2 mg/dL (ref 8.7–10.2)
Chloride: 102 mmol/L (ref 96–106)
Creatinine, Ser: 0.99 mg/dL (ref 0.76–1.27)
Globulin, Total: 2.1 g/dL (ref 1.5–4.5)
Glucose: 79 mg/dL (ref 70–99)
Potassium: 4.6 mmol/L (ref 3.5–5.2)
Sodium: 139 mmol/L (ref 134–144)
Total Protein: 6.5 g/dL (ref 6.0–8.5)
eGFR: 101 mL/min/{1.73_m2} (ref >59)

## 2022-11-08 LAB — CBC WITH DIFFERENTIAL/PLATELET
Basophils Absolute: 0.1 10*3/uL (ref 0.0–0.2)
Basos: 1 %
EOS (ABSOLUTE): 0.3 10*3/uL (ref 0.0–0.4)
Eos: 3 %
Hematocrit: 46.6 % (ref 37.5–51.0)
Hemoglobin: 15.4 g/dL (ref 13.0–17.7)
Immature Grans (Abs): 0 10*3/uL (ref 0.0–0.1)
Immature Granulocytes: 0 %
Lymphocytes Absolute: 3.1 10*3/uL (ref 0.7–3.1)
Lymphs: 28 %
MCH: 29.8 pg (ref 26.6–33.0)
MCHC: 33 g/dL (ref 31.5–35.7)
MCV: 90 fL (ref 79–97)
Monocytes Absolute: 0.9 10*3/uL (ref 0.1–0.9)
Monocytes: 8 %
Neutrophils Absolute: 6.5 10*3/uL (ref 1.4–7.0)
Neutrophils: 60 %
Platelets: 311 10*3/uL (ref 150–450)
RBC: 5.17 x10E6/uL (ref 4.14–5.80)
RDW: 12.6 % (ref 11.6–15.4)
WBC: 10.9 10*3/uL — ABNORMAL HIGH (ref 3.4–10.8)

## 2022-11-08 LAB — LIPID PANEL
Chol/HDL Ratio: 3 ratio (ref 0.0–5.0)
Cholesterol, Total: 145 mg/dL (ref 100–199)
HDL: 49 mg/dL (ref >39)
LDL Chol Calc (NIH): 73 mg/dL (ref 0–99)
Triglycerides: 131 mg/dL (ref 0–149)
VLDL Cholesterol Cal: 23 mg/dL (ref 5–40)

## 2022-11-08 LAB — TSH: TSH: 3 u[IU]/mL (ref 0.450–4.500)

## 2022-11-08 LAB — VITAMIN D 25 HYDROXY (VIT D DEFICIENCY, FRACTURES): Vit D, 25-Hydroxy: 20.6 ng/mL — ABNORMAL LOW (ref 30.0–100.0)

## 2022-11-10 ENCOUNTER — Other Ambulatory Visit: Payer: Self-pay | Admitting: Family Medicine

## 2022-11-10 DIAGNOSIS — E559 Vitamin D deficiency, unspecified: Secondary | ICD-10-CM

## 2022-11-10 MED ORDER — VITAMIN D (ERGOCALCIFEROL) 1.25 MG (50000 UNIT) PO CAPS: 50000.0000 [IU] | ORAL_CAPSULE | ORAL | 0 refills | Status: DC

## 2022-11-20 ENCOUNTER — Telehealth: Payer: Self-pay | Admitting: Family Medicine

## 2022-11-20 NOTE — Telephone Encounter (Signed)
I spoke to pt and advised Clinton Evans is out of office till next week and advised pt to see if another pharmacy has the medication in stock and then have the rx transferred. Pt voiced understanding and will call back if he has any further questions or concerns.

## 2022-12-02 ENCOUNTER — Telehealth: Payer: BLUE CROSS/BLUE SHIELD | Admitting: Physician Assistant

## 2022-12-02 DIAGNOSIS — L237 Allergic contact dermatitis due to plants, except food: Secondary | ICD-10-CM

## 2022-12-02 MED ORDER — PREDNISONE 10 MG PO TABS: ORAL_TABLET | ORAL | 0 refills | Status: AC

## 2022-12-02 NOTE — Progress Notes (Signed)
I have spent 5 minutes in review of e-visit questionnaire, review and updating patient chart, medical decision making and response to patient.   Mia Milan Cody Jacklynn Dehaas, PA-C    

## 2022-12-02 NOTE — Progress Notes (Signed)

## 2023-02-10 ENCOUNTER — Encounter: Payer: BLUE CROSS/BLUE SHIELD | Admitting: Professional Counselor

## 2023-02-23 ENCOUNTER — Telehealth: Payer: Self-pay | Admitting: Family Medicine

## 2023-02-23 DIAGNOSIS — Z0279 Encounter for issue of other medical certificate: Secondary | ICD-10-CM

## 2023-02-23 NOTE — Telephone Encounter (Signed)
Clinton Evans dropped off [employer]  forms to be completed and signed.  Form Fee Paid? (Y/N)       y     If NO, form is placed on front office manager desk to hold until payment received. If YES, then form will be placed in the RX/HH Nurse Coordinators box for completion.  Form will not be processed until payment is received

## 2023-04-04 ENCOUNTER — Other Ambulatory Visit: Payer: Self-pay | Admitting: Family Medicine

## 2023-04-04 DIAGNOSIS — I10 Essential (primary) hypertension: Secondary | ICD-10-CM

## 2023-05-03 ENCOUNTER — Other Ambulatory Visit: Payer: Self-pay | Admitting: Family Medicine

## 2023-05-03 DIAGNOSIS — I10 Essential (primary) hypertension: Secondary | ICD-10-CM

## 2023-05-15 ENCOUNTER — Other Ambulatory Visit: Payer: Self-pay | Admitting: Family Medicine

## 2023-05-15 DIAGNOSIS — G4700 Insomnia, unspecified: Secondary | ICD-10-CM

## 2023-05-15 NOTE — Telephone Encounter (Signed)
 30 day supply given, pt needs appt for further refills. Please schedule with PCP.

## 2023-05-15 NOTE — Telephone Encounter (Signed)
 Apt scheduled.

## 2023-06-03 ENCOUNTER — Ambulatory Visit: Admitting: Family Medicine

## 2023-06-05 ENCOUNTER — Other Ambulatory Visit: Payer: Self-pay | Admitting: Family Medicine

## 2023-06-05 DIAGNOSIS — I10 Essential (primary) hypertension: Secondary | ICD-10-CM

## 2023-06-05 NOTE — Telephone Encounter (Signed)
 Patient aware and made appt to see PCP on Monday 4/7 at 10 am.

## 2023-06-05 NOTE — Telephone Encounter (Signed)
 Clinton Evans pt NTBS 30-d given 05/04/23

## 2023-06-08 ENCOUNTER — Ambulatory Visit: Admitting: Family Medicine

## 2023-06-08 ENCOUNTER — Encounter: Payer: Self-pay | Admitting: Family Medicine

## 2023-06-08 VITALS — BP 138/80 | HR 87 | Temp 98.3°F | Ht 73.0 in | Wt 220.4 lb

## 2023-06-08 DIAGNOSIS — Z720 Tobacco use: Secondary | ICD-10-CM

## 2023-06-08 DIAGNOSIS — I10 Essential (primary) hypertension: Secondary | ICD-10-CM

## 2023-06-08 DIAGNOSIS — G4700 Insomnia, unspecified: Secondary | ICD-10-CM

## 2023-06-08 MED ORDER — VARENICLINE TARTRATE 1 MG PO TABS: 1.0000 mg | ORAL_TABLET | Freq: Two times a day (BID) | ORAL | 0 refills | Status: AC

## 2023-06-08 MED ORDER — AMLODIPINE BESYLATE 5 MG PO TABS: 5.0000 mg | ORAL_TABLET | Freq: Every day | ORAL | 3 refills | Status: DC

## 2023-06-08 MED ORDER — DOXEPIN HCL 3 MG PO TABS: 1.0000 | ORAL_TABLET | Freq: Every day | ORAL | 3 refills | Status: AC

## 2023-06-08 MED ORDER — VARENICLINE TARTRATE 0.5 MG PO TABS: ORAL_TABLET | ORAL | 0 refills | Status: AC

## 2023-06-08 NOTE — Progress Notes (Signed)
 Established Patient Office Visit  Subjective   Patient ID: Clinton Evans, male    DOB: 1986/03/02  Age: 38 y.o. MRN: [id number]  Chief Complaint  Patient presents with   Medical Management of Chronic Issues    HPI  HTN Complaint with meds - Yes Current Medications - amlodipine 5 mg Pertinent ROS:  Visual Disturbances - No Chest pain - No Dyspnea - No Palpitations - No LE edema - No  2. Insomnia Improved with doxepin. Has some vivid dreams but no other side effect. Working nights often so sleep varies.   3. Tobacco use Did well with chantix in September. Quit smoking for about 4 months. Started back about 6 weeks ago. Now smoking 1 PDD now. Would like to try chantix again.      06/08/2023   10:13 AM 11/07/2022    8:13 AM 07/02/2022    4:21 PM  Depression screen PHQ 2/9  Decreased Interest 0 0 0  Down, Depressed, Hopeless 0 0 0  PHQ - 2 Score 0 0 0  Altered sleeping 3 0 0  Tired, decreased energy 0 0 0  Change in appetite 0 0 0  Feeling bad or failure about yourself  0 0 0  Trouble concentrating 0 0 0  Moving slowly or fidgety/restless 0 0 0  Suicidal thoughts 0 0 0  PHQ-9 Score 3 0 0  Difficult doing work/chores Somewhat difficult Not difficult at all Not difficult at all      06/08/2023   10:15 AM 11/07/2022    8:13 AM 07/02/2022    4:21 PM 03/19/2022   11:34 AM  GAD 7 : Generalized Anxiety Score  Nervous, Anxious, on Edge 0 0 0 0  Control/stop worrying 0 0 0 0  Worry too much - different things 0 0 0 0  Trouble relaxing 0 0 0 0  Restless 0 0 0 0  Easily annoyed or irritable 0 0 0 1  Afraid - awful might happen 0 0 0 0  Total GAD 7 Score 0 0 0 1  Anxiety Difficulty Not difficult at all Not difficult at all Not difficult at all Not difficult at all     Past Medical History:  Diagnosis Date   ADHD    Depression 04/29/2016   Medical history non-contributory       ROS Negative unless specially indicated above in HPI.    Objective:     BP 138/80    Pulse 87   Temp 98.3 F (36.8 C) (Temporal)   Ht 6\' 1"  (1.854 m)   Wt 220 lb 6.4 oz (100 kg)   SpO2 99%   BMI 29.08 kg/m    Physical Exam Vitals and nursing note reviewed.  Constitutional:      General: He is not in acute distress.    Appearance: Normal appearance. He is not ill-appearing.  Cardiovascular:     Rate and Rhythm: Normal rate and regular rhythm.     Pulses: Normal pulses.     Heart sounds: Normal heart sounds. No murmur heard. Pulmonary:     Effort: Pulmonary effort is normal. No respiratory distress.     Breath sounds: Normal breath sounds.  Musculoskeletal:     Cervical back: Neck supple. No tenderness.     Right lower leg: No edema.     Left lower leg: No edema.  Lymphadenopathy:     Cervical: No cervical adenopathy.  Skin:    General: Skin is warm and dry.  Neurological:  General: No focal deficit present.     Mental Status: He is alert and oriented to person, place, and time.  Psychiatric:        Mood and Affect: Mood normal.        Behavior: Behavior normal.      No results found for any visits on 06/08/23.    The ASCVD Risk score (Arnett DK, et al., 2019) failed to calculate for the following reasons:   The 2019 ASCVD risk score is only valid for ages 37 to 47    Assessment & Plan:   Blandon was seen today for medical management of chronic issues.  Diagnoses and all orders for this visit:  Primary hypertension Well controlled on current regimen.  -     amLODipine (NORVASC) 5 MG tablet; Take 1 tablet (5 mg total) by mouth daily.  Mixed insomnia Continue doxepin.  -     Doxepin HCl 3 MG TABS; Take 1 tablet (3 mg total) by mouth at bedtime.  Tobacco abuse 5 minutes of smoking cessation instruction/counseling given:  counseled patient on the dangers of tobacco use, advised patient to stop smoking, and reviewed strategies to maximize success. Chantix discussed and ordered as below.  -     varenicline (CHANTIX) 0.5 MG tablet; On days 1 to  3, take 0.5 mg once daily. On days 4 to 7, take 0.5 mg twice daily. -     varenicline (CHANTIX CONTINUING MONTH PAK) 1 MG tablet; Take 1 tablet (1 mg total) by mouth 2 (two) times daily.   Return in about 6 months (around 12/08/2023) for CPE with fasting labs.   The patient indicates understanding of these issues and agrees with the plan.  Gabriel Earing, FNP

## 2023-12-07 ENCOUNTER — Ambulatory Visit: Admitting: Family Medicine

## 2023-12-07 ENCOUNTER — Encounter: Payer: Self-pay | Admitting: Family Medicine

## 2023-12-07 VITALS — BP 130/88 | HR 71 | Temp 97.8°F | Ht 73.0 in | Wt 215.2 lb

## 2023-12-07 DIAGNOSIS — E559 Vitamin D deficiency, unspecified: Secondary | ICD-10-CM

## 2023-12-07 DIAGNOSIS — I10 Essential (primary) hypertension: Secondary | ICD-10-CM

## 2023-12-07 DIAGNOSIS — Z0001 Encounter for general adult medical examination with abnormal findings: Secondary | ICD-10-CM | POA: Diagnosis not present

## 2023-12-07 DIAGNOSIS — Z130 Encounter for screening for diseases of the blood and blood-forming organs and certain disorders involving the immune mechanism: Secondary | ICD-10-CM

## 2023-12-07 DIAGNOSIS — Z720 Tobacco use: Secondary | ICD-10-CM

## 2023-12-07 DIAGNOSIS — Z0000 Encounter for general adult medical examination without abnormal findings: Secondary | ICD-10-CM

## 2023-12-07 DIAGNOSIS — Z13220 Encounter for screening for lipoid disorders: Secondary | ICD-10-CM

## 2023-12-07 MED ORDER — AMLODIPINE BESYLATE 5 MG PO TABS: 5.0000 mg | ORAL_TABLET | Freq: Every day | ORAL | 3 refills | Status: AC

## 2023-12-07 NOTE — Progress Notes (Signed)
 Complete physical exam  Patient: Clinton Evans   DOB: 1985/08/12   38 y.o. Male  MRN: [id number]  Subjective:    Chief Complaint  Patient presents with   Annual Exam    Clinton Evans is a 38 y.o. male who presents today for a complete physical exam. He reports consuming a general diet. The patient has a physically strenuous job, but has no regular exercise apart from work.  He generally feels well. He reports sleeping fairly well. He does not have additional problems to discuss today.   Tobacco use - Significantly reduced cigarette consumption from two packs per day to two packs per week - Describes quitting smoking as very challenging - Previously used Chantix  for smoking cessation  Most recent fall risk assessment:    12/07/2023    8:57 AM  Fall Risk   Falls in the past year? 0  Number falls in past yr: 0  Injury with Fall? 0  Risk for fall due to : No Fall Risks  Follow up Falls evaluation completed     Most recent depression screenings:    12/07/2023    8:56 AM 06/08/2023   10:13 AM  PHQ 2/9 Scores  PHQ - 2 Score 0 0  PHQ- 9 Score 0 3        Patient Care Team: Joesph Annabella HERO, FNP as PCP - General (Family Medicine)   Outpatient Medications Prior to Visit  Medication Sig   amLODipine  (NORVASC ) 5 MG tablet Take 1 tablet (5 mg total) by mouth daily.   Doxepin  HCl 3 MG TABS Take 1 tablet (3 mg total) by mouth at bedtime.   Multiple Vitamins-Minerals (CENTRUM MEN PO) Take by mouth.   varenicline  (CHANTIX  CONTINUING MONTH PAK) 1 MG tablet Take 1 tablet (1 mg total) by mouth 2 (two) times daily.   varenicline  (CHANTIX ) 0.5 MG tablet On days 1 to 3, take 0.5 mg once daily. On days 4 to 7, take 0.5 mg twice daily.   No facility-administered medications prior to visit.    ROS Negative unless specially indicated above in HPI.     Objective:     BP 130/88   Pulse 71   Temp 97.8 F (36.6 C) (Temporal)   Ht 6' 1 (1.854 m)   Wt 215 lb 3.2 oz (97.6 kg)    SpO2 100%   BMI 28.39 kg/m    Physical Exam Vitals and nursing note reviewed.  Constitutional:      General: He is not in acute distress.    Appearance: Normal appearance. He is not ill-appearing, toxic-appearing or diaphoretic.  HENT:     Head: Normocephalic.     Right Ear: Tympanic membrane, ear canal and external ear normal.     Left Ear: Tympanic membrane, ear canal and external ear normal.     Nose: Nose normal.     Mouth/Throat:     Mouth: Mucous membranes are moist.     Pharynx: Oropharynx is clear.  Eyes:     Extraocular Movements: Extraocular movements intact.     Conjunctiva/sclera: Conjunctivae normal.     Pupils: Pupils are equal, round, and reactive to light.  Cardiovascular:     Rate and Rhythm: Normal rate and regular rhythm.     Pulses: Normal pulses.     Heart sounds: Normal heart sounds. No murmur heard.    No friction rub. No gallop.  Pulmonary:     Effort: Pulmonary effort is normal.  Breath sounds: Normal breath sounds.  Abdominal:     General: Bowel sounds are normal. There is no distension.     Palpations: Abdomen is soft. There is no mass.     Tenderness: There is no abdominal tenderness. There is no guarding.  Musculoskeletal:        General: No swelling. Normal range of motion.     Cervical back: Normal range of motion and neck supple. No tenderness.  Skin:    General: Skin is warm and dry.     Capillary Refill: Capillary refill takes less than 2 seconds.     Findings: No lesion or rash.  Neurological:     General: No focal deficit present.     Mental Status: He is alert and oriented to person, place, and time.  Psychiatric:        Mood and Affect: Mood normal.        Behavior: Behavior normal.        Thought Content: Thought content normal.      No results found for any visits on 12/07/23.     Assessment & Plan:    Routine Health Maintenance and Physical Exam  [name] was seen today for annual exam.  Diagnoses and all orders for  this visit:  Routine general medical examination at a health care facility  Primary hypertension Well controlled on current regimen.  -     amLODipine  (NORVASC ) 5 MG tablet; Take 1 tablet (5 mg total) by mouth daily.  Tobacco abuse Has cut back but not ready to quit.   Vitamin D  deficiency -     VITAMIN D  25 Hydroxy (Vit-D Deficiency, Fractures)  Screening for endocrine, metabolic and immunity disorder -     CBC with Differential/Platelet -     CMP14+EGFR -     TSH  Encounter for screening for lipid disorder -     Lipid panel    Immunization History  Administered Date(s) Administered   Influenza, Seasonal, Injecte, Preservative Fre 05/30/2014   Influenza,inj,Quad PF,6+ Mos 11/20/2021   Pneumococcal Polysaccharide-23 05/30/2014   Tdap 04/29/2016    Health Maintenance  Topic Date Due   COVID-19 Vaccine (1 - 2024-25 season) 12/23/2023 (Originally 11/02/2023)   Influenza Vaccine  05/31/2024 (Originally 10/02/2023)   Pneumococcal Vaccine (2 of 2 - PCV) 06/07/2024 (Originally 05/30/2015)   Hepatitis B Vaccines 19-59 Average Risk (1 of 3 - 19+ 3-dose series) 12/06/2024 (Originally 10/06/2004)   HPV VACCINES (1 - 3-dose SCDM series) 12/06/2024 (Originally 10/06/2012)   Hepatitis C Screening  12/06/2024 (Originally 10/07/2003)   DTaP/Tdap/Td (2 - Td or Tdap) 04/29/2026   HIV Screening  Completed   Meningococcal B Vaccine  Aged Out    Discussed health benefits of physical activity, and encouraged him to engage in regular exercise appropriate for his age and condition.  Problem List Items Addressed This Visit       Cardiovascular and Mediastinum   Primary hypertension   Relevant Medications   amLODipine  (NORVASC ) 5 MG tablet     Other   Vitamin D  deficiency   Relevant Orders   VITAMIN D  25 Hydroxy (Vit-D Deficiency, Fractures)   Other Visit Diagnoses       Routine general medical examination at a health care facility    -  Primary     Tobacco abuse         Screening for  endocrine, metabolic and immunity disorder       Relevant Orders   CBC with Differential/Platelet  CMP14+EGFR   TSH     Encounter for screening for lipid disorder       Relevant Orders   Lipid panel      Return in about 6 months (around 06/06/2024) for chronic follow up.   The patient indicates understanding of these issues and agrees with the plan.  Annabella CHRISTELLA Search, FNP

## 2023-12-07 NOTE — Patient Instructions (Signed)
 Health Maintenance, Male  Adopting a healthy lifestyle and getting preventive care are important in promoting health and wellness. Ask your health care provider about:  The right schedule for you to have regular tests and exams.  Things you can do on your own to prevent diseases and keep yourself healthy.  What should I know about diet, weight, and exercise?  Eat a healthy diet    Eat a diet that includes plenty of vegetables, fruits, low-fat dairy products, and lean protein.  Do not eat a lot of foods that are high in solid fats, added sugars, or sodium.  Maintain a healthy weight  Body mass index (BMI) is a measurement that can be used to identify possible weight problems. It estimates body fat based on height and weight. Your health care provider can help determine your BMI and help you achieve or maintain a healthy weight.  Get regular exercise  Get regular exercise. This is one of the most important things you can do for your health. Most adults should:  Exercise for at least 150 minutes each week. The exercise should increase your heart rate and make you sweat (moderate-intensity exercise).  Do strengthening exercises at least twice a week. This is in addition to the moderate-intensity exercise.  Spend less time sitting. Even light physical activity can be beneficial.  Watch cholesterol and blood lipids  Have your blood tested for lipids and cholesterol at 38 years of age, then have this test every 5 years.  You may need to have your cholesterol levels checked more often if:  Your lipid or cholesterol levels are high.  You are older than 38 years of age.  You are at high risk for heart disease.  What should I know about cancer screening?  Many types of cancers can be detected early and may often be prevented. Depending on your health history and family history, you may need to have cancer screening at various ages. This may include screening for:  Colorectal cancer.  Prostate cancer.  Skin cancer.  Lung  cancer.  What should I know about heart disease, diabetes, and high blood pressure?  Blood pressure and heart disease  High blood pressure causes heart disease and increases the risk of stroke. This is more likely to develop in people who have high blood pressure readings or are overweight.  Talk with your health care provider about your target blood pressure readings.  Have your blood pressure checked:  Every 3-5 years if you are 24-52 years of age.  Every year if you are 3 years old or older.  If you are between the ages of 38 and 72 and are a current or former smoker, ask your health care provider if you should have a one-time screening for abdominal aortic aneurysm (AAA).  Diabetes  Have regular diabetes screenings. This checks your fasting blood sugar level. Have the screening done:  Once every three years after age 38 if you are at a normal weight and have a low risk for diabetes.  More often and at a younger age if you are overweight or have a high risk for diabetes.  What should I know about preventing infection?  Hepatitis B  If you have a higher risk for hepatitis B, you should be screened for this virus. Talk with your health care provider to find out if you are at risk for hepatitis B infection.  Hepatitis C  Blood testing is recommended for:  Everyone born from [birth year] through 1965.  Anyone  with known risk factors for hepatitis C.  Sexually transmitted infections (STIs)  You should be screened each year for STIs, including gonorrhea and chlamydia, if:  You are sexually active and are younger than 38 years of age.  You are older than 38 years of age and your health care provider tells you that you are at risk for this type of infection.  Your sexual activity has changed since you were last screened, and you are at increased risk for chlamydia or gonorrhea. Ask your health care provider if you are at risk.  Ask your health care provider about whether you are at high risk for HIV. Your health care provider  may recommend a prescription medicine to help prevent HIV infection. If you choose to take medicine to prevent HIV, you should first get tested for HIV. You should then be tested every 3 months for as long as you are taking the medicine.  Follow these instructions at home:  Alcohol use  Do not drink alcohol if your health care provider tells you not to drink.  If you drink alcohol:  Limit how much you have to 0-2 drinks a day.  Know how much alcohol is in your drink. In the [place] one drink equals one 12 oz bottle of beer (355 mL), one 5 oz glass of wine (148 mL), or one 1 oz glass of hard liquor (44 mL).  Lifestyle  Do not use any products that contain nicotine or tobacco. These products include cigarettes, chewing tobacco, and vaping devices, such as e-cigarettes. If you need help quitting, ask your health care provider.  Do not use street drugs.  Do not share needles.  Ask your health care provider for help if you need support or information about quitting drugs.  General instructions  Schedule regular health, dental, and eye exams.  Stay current with your vaccines.  Tell your health care provider if:  You often feel depressed.  You have ever been abused or do not feel safe at home.  Summary  Adopting a healthy lifestyle and getting preventive care are important in promoting health and wellness.  Follow your health care provider's instructions about healthy diet, exercising, and getting tested or screened for diseases.  Follow your health care provider's instructions on monitoring your cholesterol and blood pressure.  This information is not intended to replace advice given to you by your health care provider. Make sure you discuss any questions you have with your health care provider.  Document Revised: 07/09/2020 Document Reviewed: 07/09/2020  [employer]vier Patient Education  2024 [employer].

## 2023-12-08 LAB — CBC WITH DIFFERENTIAL/PLATELET
Basophils Absolute: 0.1 x10E3/uL (ref 0.0–0.2)
Basos: 1 %
EOS (ABSOLUTE): 0 x10E3/uL (ref 0.0–0.4)
Eos: 0 %
Hematocrit: 49.5 % (ref 37.5–51.0)
Hemoglobin: 16.3 g/dL (ref 13.0–17.7)
Immature Grans (Abs): 0 x10E3/uL (ref 0.0–0.1)
Immature Granulocytes: 0 %
Lymphocytes Absolute: 2.9 x10E3/uL (ref 0.7–3.1)
Lymphs: 31 %
MCH: 30.1 pg (ref 26.6–33.0)
MCHC: 32.9 g/dL (ref 31.5–35.7)
MCV: 92 fL (ref 79–97)
Monocytes Absolute: 0.7 x10E3/uL (ref 0.1–0.9)
Monocytes: 7 %
Neutrophils Absolute: 5.5 x10E3/uL (ref 1.4–7.0)
Neutrophils: 61 %
Platelets: 327 x10E3/uL (ref 150–450)
RBC: 5.41 x10E6/uL (ref 4.14–5.80)
RDW: 13.1 % (ref 11.6–15.4)
WBC: 9.2 x10E3/uL (ref 3.4–10.8)

## 2023-12-08 LAB — CMP14+EGFR
ALT: 22 IU/L (ref 0–44)
AST: 23 IU/L (ref 0–40)
Albumin: 4.4 g/dL (ref 4.1–5.1)
Alkaline Phosphatase: 86 IU/L (ref 47–123)
BUN/Creatinine Ratio: 8 — ABNORMAL LOW (ref 9–20)
BUN: 8 mg/dL (ref 6–20)
Bilirubin Total: 0.5 mg/dL (ref 0.0–1.2)
CO2: 22 mmol/L (ref 20–29)
Calcium: 9.2 mg/dL (ref 8.7–10.2)
Chloride: 103 mmol/L (ref 96–106)
Creatinine, Ser: 1.05 mg/dL (ref 0.76–1.27)
Globulin, Total: 2.4 g/dL (ref 1.5–4.5)
Glucose: 89 mg/dL (ref 70–99)
Potassium: 4.8 mmol/L (ref 3.5–5.2)
Sodium: 140 mmol/L (ref 134–144)
Total Protein: 6.8 g/dL (ref 6.0–8.5)
eGFR: 93 mL/min/1.73 (ref >59)

## 2023-12-08 LAB — LIPID PANEL
Chol/HDL Ratio: 2.5 ratio (ref 0.0–5.0)
Cholesterol, Total: 150 mg/dL (ref 100–199)
HDL: 59 mg/dL (ref >39)
LDL Chol Calc (NIH): 63 mg/dL (ref 0–99)
Triglycerides: 169 mg/dL — ABNORMAL HIGH (ref 0–149)
VLDL Cholesterol Cal: 28 mg/dL (ref 5–40)

## 2023-12-08 LAB — TSH: TSH: 2.09 u[IU]/mL (ref 0.450–4.500)

## 2023-12-08 LAB — VITAMIN D 25 HYDROXY (VIT D DEFICIENCY, FRACTURES): Vit D, 25-Hydroxy: 20.2 ng/mL — AB (ref 30.0–100.0)

## 2023-12-09 ENCOUNTER — Ambulatory Visit: Payer: Self-pay | Admitting: Family Medicine

## 2023-12-09 DIAGNOSIS — E559 Vitamin D deficiency, unspecified: Secondary | ICD-10-CM

## 2023-12-09 MED ORDER — VITAMIN D (ERGOCALCIFEROL) 1.25 MG (50000 UNIT) PO CAPS: 50000.0000 [IU] | ORAL_CAPSULE | ORAL | 0 refills | Status: DC

## 2024-03-03 ENCOUNTER — Other Ambulatory Visit: Payer: Self-pay | Admitting: Family Medicine

## 2024-03-03 DIAGNOSIS — E559 Vitamin D deficiency, unspecified: Secondary | ICD-10-CM

## 2024-06-13 ENCOUNTER — Ambulatory Visit: Payer: Self-pay | Admitting: Family Medicine
